# Patient Record
Sex: Male | Born: 1995 | Race: White | Hispanic: No | Marital: Single | Smoking: Never smoker
Health system: Southern US, Community
[De-identification: ages and names within clinical notes are randomized; demographics above are authoritative.]

## PROBLEM LIST (undated history)

## (undated) DIAGNOSIS — R4189 Other symptoms and signs involving cognitive functions and awareness: Secondary | ICD-10-CM

## (undated) DIAGNOSIS — R251 Tremor, unspecified: Secondary | ICD-10-CM

## (undated) DIAGNOSIS — F840 Autistic disorder: Secondary | ICD-10-CM

## (undated) DIAGNOSIS — K219 Gastro-esophageal reflux disease without esophagitis: Secondary | ICD-10-CM

## (undated) DIAGNOSIS — G40909 Epilepsy, unspecified, not intractable, without status epilepticus: Secondary | ICD-10-CM

## (undated) HISTORY — PX: OTHER SURGICAL HISTORY: SHX169

## (undated) HISTORY — DX: Tremor, unspecified: R25.1

## (undated) HISTORY — DX: Epilepsy, unspecified, not intractable, without status epilepticus: G40.909

## (undated) HISTORY — DX: Gastro-esophageal reflux disease without esophagitis: K21.9

## (undated) HISTORY — DX: Other symptoms and signs involving cognitive functions and awareness: R41.89

---

## 2017-12-24 ENCOUNTER — Emergency Department (HOSPITAL_COMMUNITY): Payer: Medicaid Other

## 2017-12-24 ENCOUNTER — Inpatient Hospital Stay (HOSPITAL_COMMUNITY)
Admission: EM | Admit: 2017-12-24 | Discharge: 2017-12-31 | DRG: 100 | Disposition: A | Discharge status: Home / Self Care | Payer: Medicaid Other | Attending: Internal Medicine | Admitting: Internal Medicine

## 2017-12-24 ENCOUNTER — Inpatient Hospital Stay (HOSPITAL_COMMUNITY): Payer: Medicaid Other

## 2017-12-24 ENCOUNTER — Encounter (HOSPITAL_COMMUNITY): Payer: Self-pay | Admitting: Internal Medicine

## 2017-12-24 DIAGNOSIS — E8770 Fluid overload, unspecified: Secondary | ICD-10-CM | POA: Diagnosis present

## 2017-12-24 DIAGNOSIS — R4182 Altered mental status, unspecified: Secondary | ICD-10-CM

## 2017-12-24 DIAGNOSIS — R519 Headache, unspecified: Secondary | ICD-10-CM

## 2017-12-24 DIAGNOSIS — R0689 Other abnormalities of breathing: Secondary | ICD-10-CM

## 2017-12-24 DIAGNOSIS — J9600 Acute respiratory failure, unspecified whether with hypoxia or hypercapnia: Secondary | ICD-10-CM | POA: Diagnosis not present

## 2017-12-24 DIAGNOSIS — R197 Diarrhea, unspecified: Secondary | ICD-10-CM | POA: Diagnosis not present

## 2017-12-24 DIAGNOSIS — R51 Headache: Secondary | ICD-10-CM | POA: Diagnosis not present

## 2017-12-24 DIAGNOSIS — I952 Hypotension due to drugs: Secondary | ICD-10-CM | POA: Diagnosis present

## 2017-12-24 DIAGNOSIS — F840 Autistic disorder: Secondary | ICD-10-CM | POA: Diagnosis present

## 2017-12-24 DIAGNOSIS — Z79899 Other long term (current) drug therapy: Secondary | ICD-10-CM

## 2017-12-24 DIAGNOSIS — R569 Unspecified convulsions: Secondary | ICD-10-CM | POA: Diagnosis not present

## 2017-12-24 DIAGNOSIS — R109 Unspecified abdominal pain: Secondary | ICD-10-CM

## 2017-12-24 DIAGNOSIS — R509 Fever, unspecified: Secondary | ICD-10-CM | POA: Diagnosis present

## 2017-12-24 DIAGNOSIS — R32 Unspecified urinary incontinence: Secondary | ICD-10-CM | POA: Diagnosis present

## 2017-12-24 DIAGNOSIS — R1013 Epigastric pain: Secondary | ICD-10-CM | POA: Diagnosis not present

## 2017-12-24 DIAGNOSIS — G40901 Epilepsy, unspecified, not intractable, with status epilepticus: Secondary | ICD-10-CM

## 2017-12-24 DIAGNOSIS — G2589 Other specified extrapyramidal and movement disorders: Secondary | ICD-10-CM

## 2017-12-24 DIAGNOSIS — J9601 Acute respiratory failure with hypoxia: Secondary | ICD-10-CM | POA: Diagnosis present

## 2017-12-24 DIAGNOSIS — G92 Toxic encephalopathy: Secondary | ICD-10-CM | POA: Diagnosis present

## 2017-12-24 DIAGNOSIS — G40A01 Absence epileptic syndrome, not intractable, with status epilepticus: Principal | ICD-10-CM | POA: Diagnosis present

## 2017-12-24 DIAGNOSIS — E876 Hypokalemia: Secondary | ICD-10-CM | POA: Diagnosis present

## 2017-12-24 DIAGNOSIS — R0600 Dyspnea, unspecified: Secondary | ICD-10-CM

## 2017-12-24 DIAGNOSIS — R251 Tremor, unspecified: Secondary | ICD-10-CM | POA: Diagnosis not present

## 2017-12-24 DIAGNOSIS — A419 Sepsis, unspecified organism: Secondary | ICD-10-CM | POA: Diagnosis not present

## 2017-12-24 DIAGNOSIS — G252 Other specified forms of tremor: Secondary | ICD-10-CM | POA: Diagnosis present

## 2017-12-24 DIAGNOSIS — E870 Hyperosmolality and hypernatremia: Secondary | ICD-10-CM | POA: Diagnosis present

## 2017-12-24 DIAGNOSIS — G259 Extrapyramidal and movement disorder, unspecified: Secondary | ICD-10-CM | POA: Diagnosis not present

## 2017-12-24 HISTORY — DX: Autistic disorder: F84.0

## 2017-12-24 LAB — CK TOTAL AND CKMB (NOT AT ARMC)
CK, MB: 1.3 ng/mL (ref 0.5–5.0)
Relative Index: 0.2 (ref 0.0–2.5)
Total CK: 794 U/L — ABNORMAL HIGH (ref 49–397)

## 2017-12-24 LAB — GLUCOSE, CAPILLARY
GLUCOSE-CAPILLARY: 89 mg/dL (ref 70–99)
Glucose-Capillary: 101 mg/dL — ABNORMAL HIGH (ref 70–99)

## 2017-12-24 LAB — CSF CELL COUNT WITH DIFFERENTIAL
RBC COUNT CSF: 6 /mm3 — AB
RBC Count, CSF: 1 /mm3 — ABNORMAL HIGH
Tube #: 1
Tube #: 4
WBC, CSF: 1 /mm3 (ref 0–5)
WBC, CSF: 3 /mm3 (ref 0–5)

## 2017-12-24 LAB — CBC WITH DIFFERENTIAL/PLATELET
Abs Immature Granulocytes: 0.05 10*3/uL (ref 0.00–0.07)
BASOS PCT: 0 %
Basophils Absolute: 0 10*3/uL (ref 0.0–0.1)
Eosinophils Absolute: 0 10*3/uL (ref 0.0–0.5)
Eosinophils Relative: 0 %
HCT: 45.3 % (ref 39.0–52.0)
Hemoglobin: 14.2 g/dL (ref 13.0–17.0)
Immature Granulocytes: 1 %
Lymphocytes Relative: 17 %
Lymphs Abs: 1.2 10*3/uL (ref 0.7–4.0)
MCH: 27.7 pg (ref 26.0–34.0)
MCHC: 31.3 g/dL (ref 30.0–36.0)
MCV: 88.5 fL (ref 80.0–100.0)
Monocytes Absolute: 1.1 10*3/uL — ABNORMAL HIGH (ref 0.1–1.0)
Monocytes Relative: 15 %
NRBC: 0 % (ref 0.0–0.2)
Neutro Abs: 4.9 10*3/uL (ref 1.7–7.7)
Neutrophils Relative %: 67 %
PLATELETS: 269 10*3/uL (ref 150–400)
RBC: 5.12 MIL/uL (ref 4.22–5.81)
RDW: 13 % (ref 11.5–15.5)
WBC: 7.3 10*3/uL (ref 4.0–10.5)

## 2017-12-24 LAB — MAGNESIUM: Magnesium: 2.3 mg/dL (ref 1.7–2.4)

## 2017-12-24 LAB — RESPIRATORY PANEL BY PCR
ADENOVIRUS-RVPPCR: NOT DETECTED
Bordetella pertussis: NOT DETECTED
CORONAVIRUS 229E-RVPPCR: NOT DETECTED
CORONAVIRUS NL63-RVPPCR: NOT DETECTED
Chlamydophila pneumoniae: NOT DETECTED
Coronavirus HKU1: NOT DETECTED
Coronavirus OC43: NOT DETECTED
Influenza A: NOT DETECTED
Influenza B: NOT DETECTED
Metapneumovirus: NOT DETECTED
Mycoplasma pneumoniae: NOT DETECTED
Parainfluenza Virus 1: NOT DETECTED
Parainfluenza Virus 2: NOT DETECTED
Parainfluenza Virus 3: NOT DETECTED
Parainfluenza Virus 4: NOT DETECTED
Respiratory Syncytial Virus: NOT DETECTED
Rhinovirus / Enterovirus: NOT DETECTED

## 2017-12-24 LAB — CBC
HCT: 39.7 % (ref 39.0–52.0)
Hemoglobin: 13 g/dL (ref 13.0–17.0)
MCH: 29.5 pg (ref 26.0–34.0)
MCHC: 32.7 g/dL (ref 30.0–36.0)
MCV: 90 fL (ref 80.0–100.0)
Platelets: 247 10*3/uL (ref 150–400)
RBC: 4.41 MIL/uL (ref 4.22–5.81)
RDW: 13.2 % (ref 11.5–15.5)
WBC: 10.1 10*3/uL (ref 4.0–10.5)
nRBC: 0 % (ref 0.0–0.2)

## 2017-12-24 LAB — I-STAT ARTERIAL BLOOD GAS, ED
Acid-base deficit: 1 mmol/L (ref 0.0–2.0)
Bicarbonate: 24.1 mmol/L (ref 20.0–28.0)
O2 Saturation: 100 %
Patient temperature: 98.6
TCO2: 25 mmol/L (ref 22–32)
pCO2 arterial: 38.7 mmHg (ref 32.0–48.0)
pH, Arterial: 7.401 (ref 7.350–7.450)
pO2, Arterial: 459 mmHg — ABNORMAL HIGH (ref 83.0–108.0)

## 2017-12-24 LAB — BASIC METABOLIC PANEL
Anion gap: 14 (ref 5–15)
BUN: 14 mg/dL (ref 6–20)
CO2: 18 mmol/L — AB (ref 22–32)
CREATININE: 0.9 mg/dL (ref 0.61–1.24)
Calcium: 9.2 mg/dL (ref 8.9–10.3)
Chloride: 118 mmol/L — ABNORMAL HIGH (ref 98–111)
GFR calc Af Amer: >60 mL/min (ref >60)
GFR calc non Af Amer: >60 mL/min (ref >60)
Glucose, Bld: 105 mg/dL — ABNORMAL HIGH (ref 70–99)
Potassium: 3.8 mmol/L (ref 3.5–5.1)
Sodium: 150 mmol/L — ABNORMAL HIGH (ref 135–145)

## 2017-12-24 LAB — PROCALCITONIN: Procalcitonin: <0.1 ng/mL

## 2017-12-24 LAB — I-STAT CG4 LACTIC ACID, ED: Lactic Acid, Venous: 0.91 mmol/L (ref 0.5–1.9)

## 2017-12-24 LAB — PHOSPHORUS: Phosphorus: 2.8 mg/dL (ref 2.5–4.6)

## 2017-12-24 LAB — PROTEIN, CSF: Total  Protein, CSF: 19 mg/dL (ref 15–45)

## 2017-12-24 LAB — COMPREHENSIVE METABOLIC PANEL
ALT: 38 U/L (ref 0–44)
AST: 42 U/L — ABNORMAL HIGH (ref 15–41)
Albumin: 3.8 g/dL (ref 3.5–5.0)
Alkaline Phosphatase: 96 U/L (ref 38–126)
Anion gap: 14 (ref 5–15)
BUN: 15 mg/dL (ref 6–20)
CO2: 21 mmol/L — ABNORMAL LOW (ref 22–32)
Calcium: 9.8 mg/dL (ref 8.9–10.3)
Chloride: 115 mmol/L — ABNORMAL HIGH (ref 98–111)
Creatinine, Ser: 1.08 mg/dL (ref 0.61–1.24)
GFR calc Af Amer: >60 mL/min (ref >60)
GFR calc non Af Amer: >60 mL/min (ref >60)
Glucose, Bld: 106 mg/dL — ABNORMAL HIGH (ref 70–99)
POTASSIUM: 3.2 mmol/L — AB (ref 3.5–5.1)
Sodium: 150 mmol/L — ABNORMAL HIGH (ref 135–145)
Total Bilirubin: 1.7 mg/dL — ABNORMAL HIGH (ref 0.3–1.2)
Total Protein: 7.2 g/dL (ref 6.5–8.1)

## 2017-12-24 LAB — LACTIC ACID, PLASMA: Lactic Acid, Venous: 1 mmol/L (ref 0.5–1.9)

## 2017-12-24 LAB — INFLUENZA PANEL BY PCR (TYPE A & B)
INFLBPCR: NEGATIVE
Influenza A By PCR: NEGATIVE

## 2017-12-24 LAB — MRSA PCR SCREENING: MRSA by PCR: NEGATIVE

## 2017-12-24 LAB — LIPASE, BLOOD: Lipase: 30 U/L (ref 11–51)

## 2017-12-24 LAB — TRIGLYCERIDES: TRIGLYCERIDES: 108 mg/dL (ref <150)

## 2017-12-24 LAB — TROPONIN I: Troponin I: <0.03 ng/mL (ref <0.03)

## 2017-12-24 LAB — GLUCOSE, CSF: GLUCOSE CSF: 87 mg/dL — AB (ref 40–70)

## 2017-12-24 MED ORDER — LACTATED RINGERS IV BOLUS
1000.0000 mL | Freq: Once | INTRAVENOUS | Status: AC
  Administered 2017-12-24: 1000 mL via INTRAVENOUS

## 2017-12-24 MED ORDER — DEXAMETHASONE SODIUM PHOSPHATE 10 MG/ML IJ SOLN: 10.0000 mg | Freq: Once | INTRAMUSCULAR | Status: DC

## 2017-12-24 MED ORDER — LEVETIRACETAM IN NACL 500 MG/100ML IV SOLN
500.0000 mg | Freq: Two times a day (BID) | INTRAVENOUS | Status: DC
  Administered 2017-12-24 – 2017-12-28 (×9): 500 mg via INTRAVENOUS
  Filled 2017-12-24 (×9): qty 100

## 2017-12-24 MED ORDER — SODIUM CHLORIDE 0.9 % IV SOLN
2.0000 g | Freq: Two times a day (BID) | INTRAVENOUS | Status: DC
  Administered 2017-12-25 (×2): 2 g via INTRAVENOUS
  Filled 2017-12-24 (×2): qty 20

## 2017-12-24 MED ORDER — LORAZEPAM 2 MG/ML IJ SOLN
1.0000 mg | Freq: Once | INTRAMUSCULAR | Status: AC
  Administered 2017-12-24: 1 mg via INTRAVENOUS

## 2017-12-24 MED ORDER — METRONIDAZOLE IN NACL 5-0.79 MG/ML-% IV SOLN
500.0000 mg | Freq: Three times a day (TID) | INTRAVENOUS | Status: DC
  Administered 2017-12-24: 500 mg via INTRAVENOUS
  Filled 2017-12-24: qty 100

## 2017-12-24 MED ORDER — CHLORHEXIDINE GLUCONATE 0.12% ORAL RINSE (MEDLINE KIT)
15.0000 mL | Freq: Two times a day (BID) | OROMUCOSAL | Status: DC
  Administered 2017-12-24 – 2017-12-31 (×10): 15 mL via OROMUCOSAL

## 2017-12-24 MED ORDER — LORAZEPAM 2 MG/ML IJ SOLN
2.0000 mg | Freq: Once | INTRAMUSCULAR | Status: AC
  Administered 2017-12-24: 2 mg via INTRAVENOUS

## 2017-12-24 MED ORDER — SODIUM CHLORIDE 0.9 % IV SOLN
INTRAVENOUS | Status: DC | PRN
  Administered 2017-12-24 – 2017-12-28 (×4): 1000 mL via INTRAVENOUS

## 2017-12-24 MED ORDER — ONDANSETRON HCL 4 MG/2ML IJ SOLN
INTRAMUSCULAR | Status: AC
  Filled 2017-12-24: qty 2

## 2017-12-24 MED ORDER — VANCOMYCIN HCL IN DEXTROSE 1-5 GM/200ML-% IV SOLN
1000.0000 mg | Freq: Once | INTRAVENOUS | Status: AC
  Administered 2017-12-24: 1000 mg via INTRAVENOUS
  Filled 2017-12-24: qty 200

## 2017-12-24 MED ORDER — PROPOFOL 1000 MG/100ML IV EMUL
INTRAVENOUS | Status: AC
  Filled 2017-12-24: qty 100

## 2017-12-24 MED ORDER — SODIUM CHLORIDE 0.9 % IV SOLN
2.0000 g | INTRAVENOUS | Status: DC
  Filled 2017-12-24 (×3): qty 2000

## 2017-12-24 MED ORDER — SODIUM CHLORIDE 0.9 % IV SOLN
1500.0000 mg | INTRAVENOUS | Status: AC
  Administered 2017-12-24: 1500 mg via INTRAVENOUS
  Filled 2017-12-24: qty 30

## 2017-12-24 MED ORDER — VANCOMYCIN HCL IN DEXTROSE 1-5 GM/200ML-% IV SOLN
1000.0000 mg | Freq: Two times a day (BID) | INTRAVENOUS | Status: DC
  Administered 2017-12-24: 1000 mg via INTRAVENOUS
  Filled 2017-12-24 (×2): qty 200

## 2017-12-24 MED ORDER — LORAZEPAM 2 MG/ML IJ SOLN
INTRAMUSCULAR | Status: AC
  Filled 2017-12-24: qty 1

## 2017-12-24 MED ORDER — PANTOPRAZOLE SODIUM 40 MG IV SOLR
40.0000 mg | Freq: Every day | INTRAVENOUS | Status: DC
  Administered 2017-12-25 – 2017-12-27 (×3): 40 mg via INTRAVENOUS
  Filled 2017-12-24 (×4): qty 40

## 2017-12-24 MED ORDER — SODIUM CHLORIDE 0.9 % IV SOLN
INTRAVENOUS | Status: DC
  Administered 2017-12-24: 16:00:00 via INTRAVENOUS

## 2017-12-24 MED ORDER — LORAZEPAM 2 MG/ML IJ SOLN
INTRAMUSCULAR | Status: AC | PRN
  Administered 2017-12-24: 2 mg via INTRAVENOUS

## 2017-12-24 MED ORDER — LORAZEPAM 2 MG/ML IJ SOLN
1.0000 mg | INTRAMUSCULAR | Status: DC | PRN
  Administered 2017-12-24: 2 mg via INTRAVENOUS
  Filled 2017-12-24: qty 1

## 2017-12-24 MED ORDER — SODIUM CHLORIDE 0.9 % IV SOLN: 2.0000 g | Freq: Two times a day (BID) | INTRAVENOUS | Status: DC

## 2017-12-24 MED ORDER — LIDOCAINE HCL (PF) 1 % IJ SOLN
10.0000 mL | Freq: Once | INTRAMUSCULAR | Status: AC
  Administered 2017-12-24: 10 mL via INTRADERMAL

## 2017-12-24 MED ORDER — LORAZEPAM 2 MG/ML IJ SOLN: 1.0000 mg | Freq: Once | INTRAMUSCULAR | Status: DC

## 2017-12-24 MED ORDER — POTASSIUM CHLORIDE 10 MEQ/100ML IV SOLN
10.0000 meq | INTRAVENOUS | Status: AC
  Administered 2017-12-24 (×5): 10 meq via INTRAVENOUS
  Filled 2017-12-24 (×5): qty 100

## 2017-12-24 MED ORDER — PHENYTOIN SODIUM 50 MG/ML IJ SOLN
100.0000 mg | Freq: Three times a day (TID) | INTRAMUSCULAR | Status: DC
  Administered 2017-12-24 – 2017-12-28 (×12): 100 mg via INTRAVENOUS
  Filled 2017-12-24 (×14): qty 2

## 2017-12-24 MED ORDER — ETOMIDATE 2 MG/ML IV SOLN
INTRAVENOUS | Status: AC | PRN
  Administered 2017-12-24: 10 mg via INTRAVENOUS

## 2017-12-24 MED ORDER — SODIUM CHLORIDE 0.9 % IV SOLN: 250.0000 mL | INTRAVENOUS | Status: DC

## 2017-12-24 MED ORDER — ACETAMINOPHEN 650 MG RE SUPP
650.0000 mg | Freq: Once | RECTAL | Status: AC
  Administered 2017-12-24: 650 mg via RECTAL
  Filled 2017-12-24: qty 1

## 2017-12-24 MED ORDER — LACTATED RINGERS IV SOLN
INTRAVENOUS | Status: DC
  Administered 2017-12-24 – 2017-12-26 (×3): via INTRAVENOUS

## 2017-12-24 MED ORDER — SODIUM CHLORIDE 0.9 % IV SOLN
2.0000 g | Freq: Once | INTRAVENOUS | Status: AC
  Administered 2017-12-24: 2 g via INTRAVENOUS
  Filled 2017-12-24: qty 2

## 2017-12-24 MED ORDER — ACETAMINOPHEN 160 MG/5ML PO SOLN: 650.0000 mg | ORAL | Status: DC | PRN

## 2017-12-24 MED ORDER — SODIUM CHLORIDE 0.9 % IV BOLUS
1000.0000 mL | Freq: Once | INTRAVENOUS | Status: AC
  Administered 2017-12-24: 1000 mL via INTRAVENOUS

## 2017-12-24 MED ORDER — PHENYLEPHRINE HCL-NACL 10-0.9 MG/250ML-% IV SOLN
25.0000 ug/min | INTRAVENOUS | Status: DC
  Administered 2017-12-24: 25 ug/min via INTRAVENOUS
  Administered 2017-12-24: 45 ug/min via INTRAVENOUS
  Administered 2017-12-24 – 2017-12-25 (×2): 55 ug/min via INTRAVENOUS
  Administered 2017-12-25: 50 ug/min via INTRAVENOUS
  Filled 2017-12-24 (×5): qty 250

## 2017-12-24 MED ORDER — FENTANYL CITRATE (PF) 100 MCG/2ML IJ SOLN
100.0000 ug | INTRAMUSCULAR | Status: DC | PRN
  Administered 2017-12-26: 100 ug via INTRAVENOUS
  Filled 2017-12-24 (×2): qty 2

## 2017-12-24 MED ORDER — ORAL CARE MOUTH RINSE
15.0000 mL | OROMUCOSAL | Status: DC
  Administered 2017-12-24 – 2017-12-27 (×25): 15 mL via OROMUCOSAL

## 2017-12-24 MED ORDER — FENTANYL CITRATE (PF) 100 MCG/2ML IJ SOLN
100.0000 ug | INTRAMUSCULAR | Status: DC | PRN
  Administered 2017-12-25 – 2017-12-27 (×3): 100 ug via INTRAVENOUS
  Filled 2017-12-24 (×3): qty 2

## 2017-12-24 MED ORDER — PROPOFOL 1000 MG/100ML IV EMUL
0.0000 ug/kg/min | INTRAVENOUS | Status: DC
  Administered 2017-12-24: 15 ug/kg/min via INTRAVENOUS
  Administered 2017-12-25: 35 ug/kg/min via INTRAVENOUS
  Administered 2017-12-25: 34.73 ug/kg/min via INTRAVENOUS
  Administered 2017-12-25: 35 ug/kg/min via INTRAVENOUS
  Administered 2017-12-25: 20 ug/kg/min via INTRAVENOUS
  Administered 2017-12-26: 30 ug/kg/min via INTRAVENOUS
  Administered 2017-12-26: 20 ug/kg/min via INTRAVENOUS
  Administered 2017-12-26: 25 ug/kg/min via INTRAVENOUS
  Administered 2017-12-27: 40 ug/kg/min via INTRAVENOUS
  Administered 2017-12-27: 35 ug/kg/min via INTRAVENOUS
  Filled 2017-12-24 (×9): qty 100

## 2017-12-24 MED ORDER — ONDANSETRON HCL 4 MG/2ML IJ SOLN
4.0000 mg | Freq: Once | INTRAMUSCULAR | Status: AC
  Administered 2017-12-24: 4 mg via INTRAVENOUS

## 2017-12-24 MED ORDER — ACETAMINOPHEN 325 MG PO TABS: 650.0000 mg | ORAL_TABLET | ORAL | Status: DC | PRN

## 2017-12-24 NOTE — Progress Notes (Signed)
   Clifford BenderDavid Mosley [id number] Transfer Data: 12/24/2017 7:43 PM Attending Provider: Kalman Shanamaswamy, Murali, MD WUJ:[id number]:System, Pcp Not In Code Status: full   Clifford BenderDavid Mosley is a 22 y.o. male patient transferred from ED Cardiac Monitoring:  [id number]   Blood pressure 100/60, pulse 96, temperature 99.5 F (37.5 C), temperature source Rectal, resp. rate 16, height 5\' 7"  (1.702 m), weight 90.7 kg, SpO2 100 %.  Allergies:  Patient has no known allergies.  Past Medical History:   has no past medical history on file.  Past Surgical History:   has no past surgical history on file. Will continue to evaluate and treat per MD orders.   Casper HarrisonSamantha K Chelle Cayton, RN 12/24/2017 6:04 PM

## 2017-12-24 NOTE — Progress Notes (Signed)
eLink Physician-Brief Progress Note Patient Name: Clifford BenderDavid Gleghorn DOB: 09/22/1995 MRN: [id number]   Date of Service  12/24/2017  HPI/Events of Note  Seizure activity per RN On cEEG  eICU Interventions  Increase propofol gtt as BP permits -upto 200 mcg neo Ativan 1-2 mg IV - 1 dose stat Neurology to recommend further , may need alternative med if hypotension with propofol gtt     Intervention Category Major Interventions: Seizures - evaluation and management  Rakesh V. Alva 12/24/2017, 8:40 PM

## 2017-12-24 NOTE — ED Provider Notes (Signed)
[facility] EMERGENCY DEPARTMENT Provider Note   CSN: [id number] Arrival date & time: 12/24/17  1219     History   Chief Complaint No chief complaint on file.   HPI Clifford Mosley is a 22 y.o. male.  HPI   22 yo M with PMHx autism, reported seizures though not on meds, here with AMS.  Pt reportedly has been increasingly confused, altered over the week. He has been less and less interactive. Over the past 24 hours, he began spiking fevers, chills, and general altered mental status. He has been shaking x 24 hours. Facility states he has shekn significantly since he arrived with them 1 week ago. Per report, he has a h/o seizures but is not on meds.   On arrival here, pt with rhythmic shaking of b/l UE and LE as well as eyes. Unable to provide history.  Level 5 caveat invoked as remainder of history, ROS, and physical exam limited due to patient's AMS.   History reviewed. No pertinent past medical history.  Patient Active Problem List   Diagnosis Date Noted  . Seizure ([place]) 12/24/2017  . Acute respiratory failure ([facility])    PMHx: Autism  PSHx: None  FHx: non-contributory        Home Medications    Prior to Admission medications   Medication Sig Start Date End Date Taking? Authorizing Provider  amphetamine-dextroamphetamine (ADDERALL) 20 MG tablet Take 40 mg by mouth daily.   Yes [provider]  diphenhydrAMINE (BENADRYL) 25 MG tablet Take 25 mg by mouth every 6 (six) hours as needed for sleep.   Yes [provider]  haloperidol (HALDOL) 0.5 MG tablet Take 0.5-1 mg by mouth 2 (two) times daily as needed for agitation.   Yes [provider]  risperiDONE (RISPERDAL) 3 MG tablet Take 3 mg by mouth 2 (two) times daily.   Yes [provider]    Family History No family history on file.  Social History Social History   Tobacco Use  . Smoking status: Not on file  Substance Use Topics  . Alcohol use: Not on file    . Drug use: Not on file     Allergies   Patient has no known allergies.   Review of Systems Review of Systems  Unable to perform ROS: Mental status change     Physical Exam Updated Vital Signs BP 121/72 (BP Location: Right Arm)   Pulse (!) 109   Temp 99.5 F (37.5 C) (Rectal)   Resp (!) 25   Ht 5' 7"  (1.702 m)   Wt 90.7 kg   SpO2 100%   BMI 31.32 kg/m   Physical Exam Vitals signs and nursing note reviewed.  Constitutional:      General: He is not in acute distress.    Appearance: He is well-developed. He is ill-appearing.     Comments: Rhythmic shaking b/l UE and LE, does not orient to examiner  HENT:     Head: Normocephalic and atraumatic.     Comments: Crusting of bilateral eyes. No pooling of secretions. Eyes:     Conjunctiva/sclera: Conjunctivae normal.  Neck:     Musculoskeletal: Neck supple.  Cardiovascular:     Rate and Rhythm: Regular rhythm. Tachycardia present.     Heart sounds: Normal heart sounds. No murmur. No friction rub.  Pulmonary:     Effort: Pulmonary effort is normal. Tachypnea present. No respiratory distress.     Breath sounds: Normal breath sounds. No wheezing or rales.  Abdominal:     General: There is no distension.     Palpations: Abdomen is soft.     Tenderness: There is no abdominal tenderness.  Skin:    General: Skin is warm.     Capillary Refill: Capillary refill takes less than 2 seconds.  Neurological:     Mental Status: He is disoriented.     Motor: Tremor present. No abnormal muscle tone.     Comments: Rhythmic shaking/tremor of bilateral upper and lower extremities. Tone appears normal however.Pupils 3 mm minimally reactive b/l. Does not orient to examiner, does not respond to noxious stimuli bilateral UE and LE.      ED Treatments / Results  Labs (all labs ordered are listed, but only abnormal results are displayed) Labs Reviewed  COMPREHENSIVE METABOLIC PANEL - Abnormal; Notable for the following components:       Result Value   Sodium 150 (*)    Potassium 3.2 (*)    Chloride 115 (*)    CO2 21 (*)    Glucose, Bld 106 (*)    AST 42 (*)    Total Bilirubin 1.7 (*)    All other components within normal limits  CBC WITH DIFFERENTIAL/PLATELET - Abnormal; Notable for the following components:   Monocytes Absolute 1.1 (*)    All other components within normal limits  CSF CELL COUNT WITH DIFFERENTIAL - Abnormal; Notable for the following components:   RBC Count, CSF 1 (*)    All other components within normal limits  CSF CELL COUNT WITH DIFFERENTIAL - Abnormal; Notable for the following components:   RBC Count, CSF 6 (*)    All other components within normal limits  GLUCOSE, CSF - Abnormal; Notable for the following components:   Glucose, CSF 87 (*)    All other components within normal limits  GLUCOSE, CAPILLARY - Abnormal; Notable for the following components:   Glucose-Capillary 101 (*)    All other components within normal limits  I-STAT ARTERIAL BLOOD GAS, ED - Abnormal; Notable for the following components:   pO2, Arterial 459.0 (*)    All other components within normal limits  RESPIRATORY PANEL BY PCR  CSF CULTURE  CULTURE, BLOOD (ROUTINE X 2)  CULTURE, BLOOD (ROUTINE X 2)  URINE CULTURE  CULTURE, RESPIRATORY  GRAM STAIN  INFLUENZA PANEL BY PCR (TYPE A & B)  PROTEIN, CSF  URINALYSIS, ROUTINE W REFLEX MICROSCOPIC  STREP PNEUMONIAE URINARY ANTIGEN  LEGIONELLA PNEUMOPHILA SEROGP 1 UR AG  HIV ANTIBODY (ROUTINE TESTING W REFLEX)  PROCALCITONIN  BASIC METABOLIC PANEL  BASIC METABOLIC PANEL  BASIC METABOLIC PANEL  BASIC METABOLIC PANEL  MAGNESIUM  PHOSPHORUS  LIPASE, BLOOD  TROPONIN I  TROPONIN I  TROPONIN I  LACTIC ACID, PLASMA  LACTIC ACID, PLASMA  CBC  BLOOD GAS, ARTERIAL  TRIGLYCERIDES  CK TOTAL AND CKMB (NOT AT [facility])  MYOGLOBIN, URINE  PROCALCITONIN  CBC  BLOOD GAS, ARTERIAL  MAGNESIUM  PHOSPHORUS  LACTIC ACID, PLASMA  I-STAT CG4 LACTIC ACID, ED  I-STAT CG4 LACTIC  ACID, ED    EKG EKG Interpretation  Date/Time:  Monday December 24 2017 12:19:49 EST Ventricular Rate:  121 PR Interval:    QRS Duration: 76 QT Interval:  375 QTC Calculation: 533 R Axis:   23 Text Interpretation:  Sinus tachycardia Prolonged QT interval No prior ECG for comparison.  Sinus tachycardia.  No STEMI Confirmed by Antony Blackbird [phone]) on 12/24/2017 5:13:59 PM   Radiology Ct Head Wo Contrast  Result Date: 12/24/2017 CLINICAL  DATA:  Altered mental status EXAM: CT HEAD WITHOUT CONTRAST TECHNIQUE: Contiguous axial images were obtained from the base of the skull through the vertex without intravenous contrast. COMPARISON:  None. FINDINGS: Brain: There is no mass, hemorrhage or extra-axial collection. The size and configuration of the ventricles and extra-axial CSF spaces are normal. The brain parenchyma is normal, without evidence of acute or chronic infarction. Vascular: No abnormal hyperdensity of the major intracranial arteries or dural venous sinuses. No intracranial atherosclerosis. Skull: The visualized skull base, calvarium and extracranial soft tissues are normal. Sinuses/Orbits: No fluid levels or advanced mucosal thickening of the visualized paranasal sinuses. No mastoid or middle ear effusion. The orbits are normal. IMPRESSION: Normal head CT. Electronically Signed   By: Ulyses Jarred M.D.   On: 12/24/2017 14:52   Dg Chest Portable 1 View  Result Date: 12/24/2017 CLINICAL DATA:  Intubation EXAM: PORTABLE CHEST 1 VIEW COMPARISON:  12/24/2017 at 12:27 p.m. FINDINGS: Support Apparatus: --Endotracheal tube: Tip at the level of the clavicular heads. --Enteric tube:Coiled within the stomach --Catheter(s):None --Other: None The heart size and mediastinal contours are within normal limits. The lungs are clear. No pleural effusion or pneumothorax. IMPRESSION: 1. No acute pulmonary process. 2. Endotracheal tube tip at the level of the clavicles and enteric tube tip and side port  within the stomach. Electronically Signed   By: Ulyses Jarred M.D.   On: 12/24/2017 14:20   Dg Chest Portable 1 View  Result Date: 12/24/2017 CLINICAL DATA:  Sepsis. EXAM: PORTABLE CHEST 1 VIEW COMPARISON:  None. FINDINGS: The cardiac silhouette, mediastinal and hilar contours are within normal limits given the low lung volumes and AP projection. The lungs are clear. No pleural effusion. Slightly dilated air-filled bowel noted in the upper abdomen. No free air. IMPRESSION: No acute cardiopulmonary findings. Electronically Signed   By: Marijo Sanes M.D.   On: 12/24/2017 12:51    Procedures .Critical Care Performed by: Duffy Bruce, MD Authorized by: Duffy Bruce, MD   Critical care provider statement:    Critical care time (minutes):  65   Critical care time was exclusive of:  Separately billable procedures and treating other patients and teaching time   Critical care was necessary to treat or prevent imminent or life-threatening deterioration of the following conditions:  Circulatory failure, CNS failure or compromise, cardiac failure and respiratory failure   Critical care was time spent personally by me on the following activities:  Development of treatment plan with patient or surrogate, discussions with consultants, evaluation of patient's response to treatment, examination of patient, obtaining history from patient or surrogate, ordering and performing treatments and interventions, ordering and review of laboratory studies, ordering and review of radiographic studies, pulse oximetry, re-evaluation of patient's condition and review of old charts   I assumed direction of critical care for this patient from another provider in my specialty: no   .Lumbar Puncture Date/Time: 12/24/2017 4:30 PM Performed by: Duffy Bruce, MD Authorized by: Duffy Bruce, MD   Consent:    Consent obtained:  Emergent situation   Consent given by:  Patient and parent   Risks discussed:  Bleeding,  headache, infection, nerve damage, repeat procedure and pain Pre-procedure details:    Procedure purpose:  Diagnostic   Preparation: Patient was prepped and draped in usual sterile fashion   Sedation:    Sedation type: Intubated, sedated on propofol. Anesthesia (see MAR for exact dosages):    Anesthesia method:  Local infiltration   Local anesthetic:  Lidocaine 1% w/o epi  Procedure details:    Lumbar space:  L3-L4 interspace   Patient position:  L lateral decubitus   Needle gauge:  20   Needle type:  Spinal needle - Quincke tip   Needle length (in):  3.5   Number of attempts:  1   Fluid appearance:  Clear   Tubes of fluid:  4   Total volume (ml):  8 Post-procedure:    Puncture site:  Adhesive bandage applied and direct pressure applied   Patient tolerance of procedure:  Tolerated well, no immediate complications Comments:     Unable to obtain pressure due to pt moving from procedure despite sedation Procedure Name: Intubation Date/Time: 12/24/2017 6:14 PM Performed by: Duffy Bruce, MD Pre-anesthesia Checklist: Patient identified, Patient being monitored, Emergency Drugs available, Timeout performed and Suction available Oxygen Delivery Method: Non-rebreather mask Preoxygenation: Pre-oxygenation with 100% oxygen Induction Type: Rapid sequence Ventilation: Mask ventilation without difficulty Laryngoscope Size: Mac and 4 Grade View: Grade I Tube size: 7.5 mm Number of attempts: 1 Airway Equipment and Method: Rigid stylet and Video-laryngoscopy Placement Confirmation: ETT inserted through vocal cords under direct vision,  CO2 detector and Breath sounds checked- equal and bilateral Secured at: 23 cm Tube secured with: Tape Dental Injury: Teeth and Oropharynx as per pre-operative assessment  Difficulty Due To: Difficulty was unanticipated Future Recommendations: Recommend- induction with short-acting agent, and alternative techniques readily available      (including  critical care time)  Medications Ordered in ED Medications  LORazepam (ATIVAN) 2 MG/ML injection (  Not Given 12/24/17 1241)  ondansetron (ZOFRAN) 4 MG/2ML injection (has no administration in time range)  LORazepam (ATIVAN) 2 MG/ML injection (has no administration in time range)  LORazepam (ATIVAN) 2 MG/ML injection (has no administration in time range)  vancomycin (VANCOCIN) IVPB 1000 mg/200 mL premix (has no administration in time range)  propofol (DIPRIVAN) 1000 MG/100ML infusion (7.5 mcg/kg/min  Rate/Dose Change 12/24/17 1547)  0.9 %  sodium chloride infusion (has no administration in time range)  phenylephrine (NEOSYNEPHRINE) 10-0.9 MG/250ML-% infusion (35 mcg/min Intravenous Rate/Dose Change 12/24/17 1548)  cefTRIAXone (ROCEPHIN) 2 g in sodium chloride 0.9 % 100 mL IVPB (has no administration in time range)  lactated ringers infusion (has no administration in time range)  pantoprazole (PROTONIX) injection 40 mg (has no administration in time range)  levETIRAcetam (KEPPRA) IVPB 500 mg/100 mL premix (has no administration in time range)  fentaNYL (SUBLIMAZE) injection 100 mcg (has no administration in time range)  fentaNYL (SUBLIMAZE) injection 100 mcg (has no administration in time range)  propofol (DIPRIVAN) 1000 MG/100ML infusion (has no administration in time range)  potassium chloride 10 mEq in 100 mL IVPB (has no administration in time range)  LORazepam (ATIVAN) injection 2 mg (2 mg Intravenous Given 12/24/17 1230)  ceFEPIme (MAXIPIME) 2 g in sodium chloride 0.9 % 100 mL IVPB (0 g Intravenous Stopped 12/24/17 1336)  vancomycin (VANCOCIN) IVPB 1000 mg/200 mL premix (0 mg Intravenous Stopped 12/24/17 1451)  sodium chloride 0.9 % bolus 1,000 mL (0 mLs Intravenous Stopped 12/24/17 1450)  sodium chloride 0.9 % bolus 1,000 mL (0 mLs Intravenous Stopped 12/24/17 1451)  acetaminophen (TYLENOL) suppository 650 mg (650 mg Rectal Given 12/24/17 1318)  lactated ringers bolus 1,000 mL (0 mLs  Intravenous Stopped 12/24/17 1451)  ondansetron (ZOFRAN) injection 4 mg (4 mg Intravenous Given 12/24/17 1332)  LORazepam (ATIVAN) injection 1 mg (1 mg Intravenous Given 12/24/17 1336)  LORazepam (ATIVAN) injection 1 mg (1 mg Intravenous Given 12/24/17 1338)  fosPHENYtoin (CEREBYX) 1,500 mg  PE in sodium chloride 0.9 % 50 mL IVPB (0 mg PE Intravenous Stopped 12/24/17 1608)  LORazepam (ATIVAN) injection (2 mg Intravenous Given 12/24/17 1350)  etomidate (AMIDATE) injection (10 mg Intravenous Given 12/24/17 1359)  lidocaine (PF) (XYLOCAINE) 1 % injection 10 mL (10 mLs Intradermal Given by Other 12/24/17 1609)     Initial Impression / Assessment and Plan / ED Course  I have reviewed the triage vital signs and the nursing notes.  Pertinent labs & imaging results that were available during my care of the patient were reviewed by me and considered in my medical decision making (see chart for details).  Clinical Course as of Dec 24 1813  Mon Dec 24, 2765  3736 22 year old autistic male here with altered mental status.  On arrival, patient in Reiger's versus seizure activity.  Ativan trial given and patient had brief resolution of shaking episodes.  Patient subsequently given additional doses, with slight improvement.  However, he was noted to have progressively worsening respiratory status with difficulty controlling his secretions and gagging.  Decision subsequently made to intubate.  Prior to intubation, I asked Dr. Malen Gauze to evaluate the patient, who confirms that patient appears to be in status based on his exam and recommends Dilantin load.  Broad-spectrum antibiotics given, will send for stat CT head.  Neurology states that patient will need LP once CT is back.  Discussed with ICU who will admit.   [CI]    Clinical Course User Index [CI] Duffy Bruce, MD    CT head neg. I was asked to complete LP by ICU - LP completed, fluid clear with subjectively normal pressure. Pt HDS. Admit to  ICU.  Final Clinical Impressions(s) / ED Diagnoses   Final diagnoses:  Status epilepticus ([place])  Acute respiratory failure with hypoxia [facility])    ED Discharge Orders    None       Duffy Bruce, MD 12/24/17 1815

## 2017-12-24 NOTE — Procedures (Signed)
Intubation Procedure Note Clifford BenderDavid Mosley [id number] 10-11-1995  Procedure: Intubation Indications: Airway protection and maintenance  Procedure Details Consent: Unable to obtain consent because of altered level of consciousness. Time Out: Verified patient identification, verified procedure, site/side was marked, verified correct patient position, special equipment/implants available, medications/allergies/relevent history reviewed, required imaging and test results available.  Performed  Maximum sterile technique was used including antiseptics.  3    Evaluation Hemodynamic Status: BP stable throughout; O2 sats: stable throughout Patient's Current Condition: stable Complications: No apparent complications Patient did tolerate procedure well. Chest X-ray ordered to verify placement.  CXR: pending.   Toula MoosCampbell, Fynn Adel Faulkner 12/24/2017

## 2017-12-24 NOTE — Consult Note (Signed)
Requesting Physician:  Erma HeritageIsaacs    Chief Complaint:   History obtained from: Patient and Chart     HPI:                                                                                                                                       Clifford Mosley is an 22 y.o. male with past medical history of autism, seizures presents to the emergency department found down seizing, febrile.  Patient is a 22 year old male with past medical history of autism recently transitioned to a group home.  Was noted to have some mild tremors on the 21st and also noted to have reduced p.o. intake by nursing staff.  Today patient was found to be lethargic nonverbal and his temperature was greater than 101.  EMS was called and shortly after arrival patient was noted to have bilateral upper and lower extremity jerking. Patient continued to have seizures on arrival to ED neurology was consulted.  On my assessment patient had a forced left gaze deviation and continuous jerking of the right upper and lower extremity.  Patient was intubated for airway protection and was loaded with fosphenytoin.  Patient underwent a head CT which was unremarkable and a lumbar puncture which showed no evidence of CNS infection.   PMH H/o of seizures, limited as patient unresponsive  FH and SH; limited as patient unresponsive   Medications:                                                                                                                        I reviewed home medications   ROS:                                                                                                                                     14 systems  reviewed and negative except above    Examination:                                                                                                      General: Appears well-developed and well-nourished.  Psych: Affect appropriate to situation Eyes: No scleral injection HENT: No OP obstrucion Head:  Normocephalic.  Cardiovascular: Normal rate and regular rhythm.  Respiratory: Effort normal and breath sounds normal to anterior ascultation GI: Soft.  No distension. There is no tenderness.  Skin: WDI    Neurological Examination ( prior to intubation)  Mental Status: Unresponsive, not following commands Cranial Nerves: II: Visual fields : unable to assess III,IV, VI: pupils equal and reactive, forced gaze deviation to the left  V,VI: face symmetric  VIII: IX,X:XI: XII: unable to assess Motor: Right upper extremity with rhythmic clonic activity, withdraws on left upper and lower extremity  Tone and bulk:normal tone throughout; no atrophy noted Sensory: unable to assess Plantars: Right: downgoing   Left: downgoing Cerebellar: unable to assess Gait: unable to assess    Lab Results: Basic Metabolic Panel: Recent Labs  Lab 12/24/17 1245  NA 150*  K 3.2*  CL 115*  CO2 21*  GLUCOSE 106*  BUN 15  CREATININE 1.08  CALCIUM 9.8    CBC: Recent Labs  Lab 12/24/17 1245  WBC 7.3  NEUTROABS 4.9  HGB 14.2  HCT 45.3  MCV 88.5  PLT 269    Coagulation Studies: No results for input(s): LABPROT, INR in the last 72 hours.  Imaging: Dg Chest Portable 1 View  Result Date: 12/24/2017 CLINICAL DATA:  Intubation EXAM: PORTABLE CHEST 1 VIEW COMPARISON:  12/24/2017 at 12:27 p.m. FINDINGS: Support Apparatus: --Endotracheal tube: Tip at the level of the clavicular heads. --Enteric tube:Coiled within the stomach --Catheter(s):None --Other: None The heart size and mediastinal contours are within normal limits. The lungs are clear. No pleural effusion or pneumothorax. IMPRESSION: 1. No acute pulmonary process. 2. Endotracheal tube tip at the level of the clavicles and enteric tube tip and side port within the stomach. Electronically Signed   By: Deatra Robinson M.D.   On: 12/24/2017 14:20   Dg Chest Portable 1 View  Result Date: 12/24/2017 CLINICAL DATA:  Sepsis. EXAM: PORTABLE CHEST 1  VIEW COMPARISON:  None. FINDINGS: The cardiac silhouette, mediastinal and hilar contours are within normal limits given the low lung volumes and AP projection. The lungs are clear. No pleural effusion. Slightly dilated air-filled bowel noted in the upper abdomen. No free air. IMPRESSION: No acute cardiopulmonary findings. Electronically Signed   By: Rudie Meyer M.D.   On: 12/24/2017 12:51     I have reviewed the above imaging CT head: no acute process seen    ASSESSMENT AND PLAN  22 year old male with past medical history of autism and seizures presents with status epilepticus requiring intubation  Status epilepticus Acute respiratory failure 2/2 neurological process ?  Sepsis/SIRS  Recommendations -Load with fosphenytoin 1,500mg  and agree with intubaton -CSF results : WBC 1 not suggestive for CNS infection  - Continue Dilantin  100mg  TID - Stat EEG shows no evidence of seizures - MRI brain after EEG leads removed - continue to evaluate and treat source of sepsis - Seizure precautions     Georgiana SpinnerSushanth Aroor MD [facility] [phone]   If 7pm to 7am, please call on call as listed on AMION.   Sushanth Aroor [facility] Pager Number [phone]

## 2017-12-24 NOTE — H&P (Addendum)
NAME:  Clifford BenderDavid Austill, MRN:  [id number], DOB:  05-11-1995, LOS: 0 ADMISSION DATE:  12/24/2017, CONSULTATION DATE:  12/23 REFERRING MD:  isacc, CHIEF COMPLAINT:  Seizure and possible sepsis   Brief History    22 year old male patient with history of autism, admitted 12/23 with working diagnosis of seizure, and possible sepsis with concern for CNS infection  History of present illness    22 year old male patient with history of autism.  Had been living with mother until approximately 1 week ago when he transition to a group home. He apparently had been transitioning well, however the care providers did begin to notice him becoming more withdrawn, around the 20th and 21st with decreased p.o. intake.  He was still drinking fluids.  He felt that this was possibly secondary to his recent transition to the group home.  On the 21st care provider noted some mild tremors, EMS was called, they could find no other abnormalities, he apparently had some history of tremor in the past so they felt no further evaluation was warranted.  On the 23rd his care provider evaluated him and found him to be more lethargic, more withdrawn, nonverbal, and diaphoretic EMS was summoned.  On arrival his temperature was greater than 101, he was obtunded, and shortly after demonstrated bilateral upper and lower extremity rhythmic seizure-like activity.  This is witnessed by both the emergency room physician and neurology.  Felt to be consistent with possible seizure, he was treated with IV benzodiazepines, intubated for airway protection, blood cultures were sent and empiric antibiotics were started.  Of note he had no report of stiff neck, no fevers prior to day of admission on 12/23, no sick exposures.  At baseline he is minimally verbal, he is able to speak in 1-4 word phrases, at approximately "22-year-old level" per his mother he is ambulatory, able to eat on his own.  He has significant behavioral problems, typically becomes  combative when agitated.  Past Medical History  Autism, possible seizures   Significant Hospital Events   12/23 admitted with working diagnosis of possible sepsis, concern for CNS infection, IV vancomycin and ceftriaxone started  Consults:  Neuro 12/23  Procedures:  oett 12/23 LP 12/23  Significant Diagnostic Tests:  *CT brain 12/23: neg  Micro Data:  BCX2 12/23>>> UC 12/23>>> resp 12/23>> CSDF 12/23>>>  Antimicrobials:  vanc 12/23>>> Rocephin 12/23>>>  Interim history/subjective:  Now intubated  Objective   Blood pressure 121/72, pulse (Abnormal) 109, temperature 99.5 F (37.5 C), temperature source Rectal, resp. rate (Abnormal) 25, height 5\' 7"  (1.702 m), weight 90.7 kg, SpO2 100 %.    Vent Mode: PRVC FiO2 (%):  [100 %] 100 % Set Rate:  [16 bmp] 16 bmp Vt Set:  [500 mL] 500 mL PEEP:  [5 cmH20] 5 cmH20 Plateau Pressure:  [21 cmH20] 21 cmH20   Intake/Output Summary (Last 24 hours) at 12/24/2017 1530 Last data filed at 12/24/2017 1451 Gross per 24 hour  Intake 3700 ml  Output no documentation  Net 3700 ml   Filed Weights   12/24/17 1319  Weight: 90.7 kg    Examination: General: 22 year old white male currently unresponsive on propofol infusion HENT: Orally intubated pupils reactive bilaterally mucous membranes moist Lungs: Clear to auscultation no accessory use diminished bases Cardiovascular: Regular rate and rhythm Abdomen:  soft nontender no organomegaly, positive bowel sounds Extremities: Warm, dry, brisk capillary refill trace edema Neuro: Currently sedated.  Still exhibiting bilateral upper extremity intermittent seizure type activity GU: Condom catheter in place Resolved  Hospital Problem list     Assessment & Plan:   Status epilepticus Plan Admit to the neuro intensive care Neurology consulted Fosphenytoin initiated Continuous EEG to start Sedation with propofol Adding IV Keppra Serial neuro exams Checking total CKs IV  hydration  Acute metabolic encephalopathy, superimposed on underlying autism with behavioral problems -Working diagnosis of possible CNS infection, also has severe hypernatremia Plan Treat seizure Correct hypernatremia Treat infection Serial neuro exams Supportive care  Severe sepsis/septic shock versus drug-related hypotension, with concern for CNS infection -CT brain negative -Spoke with infectious disease via phone Plan Panculture Lumbar puncture Broad-spectrum antibiotics with vancomycin as well as Rocephin for CNS coverage Follow-up CSF evaluation Mean arterial pressure goal greater than 65 with peripheral Neo-Synephrine Continuing IV hydration Tylenol for fever 30 mL's per kilogram crystalloid resuscitation (completed in ER)  Respiratory failure w/ need for airway protection s/p seizure  Plan Full vent support  PAD protocol RASS -3 VAP bundle F/u abg  Hypernatremia/ hyperosmolar -favor volume depletion  Plan Cont IVFs w/ LR Repeat chemistry q 4 stict I&O  At risk for AKI Plan Cont IVFs Renal dose meds Ck CKs Ck urine myoglobin  Fluid and electrolyte imbalance: hypokalemia  Plan Replace K recheck   Best practice:  Diet: tubefeeds to start 12/24 Pain/Anxiety/Delirium protocol (if indicated): 12/13 VAP protocol (if indicated): 12/23 DVT prophylaxis: PAS GI prophylaxis: PPI Glucose control: na Mobility: BR Code Status: Full Family Communication: updated Disposition: Critically ill requiring titration of anticonvulsants, continuous EEG monitoring, treatment for life-threatening infection, titration of mechanical ventilation, serial neurologic checks, and intensive care monitoring.  He will be admitted to the neuro intensive care.  Labs   CBC: Recent Labs  Lab 12/24/17 1245  WBC 7.3  NEUTROABS 4.9  HGB 14.2  HCT 45.3  MCV 88.5  PLT 269    Basic Metabolic Panel: Recent Labs  Lab 12/24/17 1245  NA 150*  K 3.2*  CL 115*  CO2 21*  GLUCOSE  106*  BUN 15  CREATININE 1.08  CALCIUM 9.8   GFR: Estimated Creatinine Clearance: 116.2 mL/min (by C-G formula based on SCr of 1.08 mg/dL). Recent Labs  Lab 12/24/17 1238 12/24/17 1245  WBC  --  7.3  LATICACIDVEN 0.91  --     Liver Function Tests: Recent Labs  Lab 12/24/17 1245  AST 42*  ALT 38  ALKPHOS 96  BILITOT 1.7*  PROT 7.2  ALBUMIN 3.8   No results for input(s): LIPASE, AMYLASE in the last 168 hours. No results for input(s): AMMONIA in the last 168 hours.  ABG No results found for: PHART, PCO2ART, PO2ART, HCO3, TCO2, ACIDBASEDEF, O2SAT   Coagulation Profile: No results for input(s): INR, PROTIME in the last 168 hours.  Cardiac Enzymes: No results for input(s): CKTOTAL, CKMB, CKMBINDEX, TROPONINI in the last 168 hours.  HbA1C: No results found for: HGBA1C  CBG: No results for input(s): GLUCAP in the last 168 hours.  Review of Systems:   Not able   Past Medical History  He,  has no past medical history on file.   Surgical History   none  Social History      Family History   His family history is not on file.   Allergies Allergies not on file   Home Medications  Prior to Admission medications   Not on File     Critical care time: 45 min      Simonne MartinetPeter E Gaynelle Pastrana ACNP-BC [facility] Pager # [phone] OR # [phone] if no  answer

## 2017-12-24 NOTE — ED Triage Notes (Signed)
Code Sepsis called upon arrival, possible seizure

## 2017-12-24 NOTE — Progress Notes (Signed)
LTM EEG started in ED; pt being transferred 3M06.

## 2017-12-24 NOTE — Progress Notes (Signed)
Pharmacy Antibiotic Note  Clifford BenderDavid Mosley is a 22 y.o. male admitted on 12/24/2017 with sepsis.  Pharmacy has been consulted for vancomycin and cefepime dosing.  Plan: Vancomycin 1gm IV Q12H Cefepime 2gm IV Q12H F/u renal fxn, C&S, clinical status and vanc peak/trough at SS  Height: 5\' 7"  (170.2 cm) Weight: 200 lb (90.7 kg) IBW/kg (Calculated) : 66.1  No data recorded.  Recent Labs  Lab 12/24/17 1238 12/24/17 1245  WBC  --  7.3  LATICACIDVEN 0.91  --     CrCl cannot be calculated (No successful lab value found.).    Allergies not on file  Antimicrobials this admission: Vanc 12/23>> Cefepime 12/23>> Flagyl 12/23>>  Dose adjustments this admission: N/A  Microbiology results: Pending  Thank you for allowing pharmacy to be a part of this patient's care.  Clifford Mosley, Clifford LeachRachel Mosley 12/24/2017 1:35 PM

## 2017-12-24 NOTE — Progress Notes (Signed)
Patient continues to have twitching in upper and lower extremities. Sometimes after stimulation. [facility] called. Received order to increase Propofol, PRN ativan and to contact Neuro. Neuro paged. 1 dose of 2mg  Ativan given, Propofol gtt increased to 4420mcg/min  Will continue to monitor

## 2017-12-25 ENCOUNTER — Inpatient Hospital Stay (HOSPITAL_COMMUNITY): Payer: Medicaid Other

## 2017-12-25 DIAGNOSIS — R569 Unspecified convulsions: Secondary | ICD-10-CM

## 2017-12-25 LAB — URINALYSIS, ROUTINE W REFLEX MICROSCOPIC
Bacteria, UA: NONE SEEN
Bilirubin Urine: NEGATIVE
GLUCOSE, UA: NEGATIVE mg/dL
Hgb urine dipstick: NEGATIVE
Ketones, ur: 80 mg/dL — AB
Leukocytes, UA: NEGATIVE
Nitrite: NEGATIVE
Protein, ur: NEGATIVE mg/dL
SPECIFIC GRAVITY, URINE: 1.028 (ref 1.005–1.030)
pH: 6 (ref 5.0–8.0)

## 2017-12-25 LAB — GLUCOSE, CAPILLARY
Glucose-Capillary: 106 mg/dL — ABNORMAL HIGH (ref 70–99)
Glucose-Capillary: 112 mg/dL — ABNORMAL HIGH (ref 70–99)
Glucose-Capillary: 114 mg/dL — ABNORMAL HIGH (ref 70–99)
Glucose-Capillary: 116 mg/dL — ABNORMAL HIGH (ref 70–99)
Glucose-Capillary: 137 mg/dL — ABNORMAL HIGH (ref 70–99)
Glucose-Capillary: 93 mg/dL (ref 70–99)

## 2017-12-25 LAB — CBC
HCT: 38.3 % — ABNORMAL LOW (ref 39.0–52.0)
Hemoglobin: 12.2 g/dL — ABNORMAL LOW (ref 13.0–17.0)
MCH: 28 pg (ref 26.0–34.0)
MCHC: 31.9 g/dL (ref 30.0–36.0)
MCV: 88 fL (ref 80.0–100.0)
PLATELETS: 259 10*3/uL (ref 150–400)
RBC: 4.35 MIL/uL (ref 4.22–5.81)
RDW: 12.9 % (ref 11.5–15.5)
WBC: 8.8 10*3/uL (ref 4.0–10.5)
nRBC: 0 % (ref 0.0–0.2)

## 2017-12-25 LAB — POCT I-STAT 3, ART BLOOD GAS (G3+)
Acid-base deficit: 2 mmol/L (ref 0.0–2.0)
Bicarbonate: 21 mmol/L (ref 20.0–28.0)
O2 Saturation: 99 %
Patient temperature: 98.6
TCO2: 22 mmol/L (ref 22–32)
pCO2 arterial: 29 mmHg — ABNORMAL LOW (ref 32.0–48.0)
pH, Arterial: 7.469 — ABNORMAL HIGH (ref 7.350–7.450)
pO2, Arterial: 133 mmHg — ABNORMAL HIGH (ref 83.0–108.0)

## 2017-12-25 LAB — BASIC METABOLIC PANEL
ANION GAP: 11 (ref 5–15)
ANION GAP: 14 (ref 5–15)
BUN: 12 mg/dL (ref 6–20)
BUN: 9 mg/dL (ref 6–20)
CO2: 19 mmol/L — ABNORMAL LOW (ref 22–32)
CO2: 22 mmol/L (ref 22–32)
Calcium: 9.3 mg/dL (ref 8.9–10.3)
Calcium: 9.3 mg/dL (ref 8.9–10.3)
Chloride: 116 mmol/L — ABNORMAL HIGH (ref 98–111)
Chloride: 113 mmol/L — ABNORMAL HIGH (ref 98–111)
Creatinine, Ser: 0.73 mg/dL (ref 0.61–1.24)
Creatinine, Ser: 0.8 mg/dL (ref 0.61–1.24)
GFR calc Af Amer: >60 mL/min (ref >60)
GFR calc Af Amer: >60 mL/min (ref >60)
GFR calc non Af Amer: >60 mL/min (ref >60)
GFR calc non Af Amer: >60 mL/min (ref >60)
Glucose, Bld: 122 mg/dL — ABNORMAL HIGH (ref 70–99)
Glucose, Bld: 114 mg/dL — ABNORMAL HIGH (ref 70–99)
Potassium: 3.7 mmol/L (ref 3.5–5.1)
Potassium: 3.4 mmol/L — ABNORMAL LOW (ref 3.5–5.1)
Sodium: 146 mmol/L — ABNORMAL HIGH (ref 135–145)
Sodium: 149 mmol/L — ABNORMAL HIGH (ref 135–145)

## 2017-12-25 LAB — LACTIC ACID, PLASMA: Lactic Acid, Venous: 1.8 mmol/L (ref 0.5–1.9)

## 2017-12-25 LAB — PROCALCITONIN: Procalcitonin: <0.1 ng/mL

## 2017-12-25 LAB — TROPONIN I: Troponin I: <0.03 ng/mL (ref <0.03)

## 2017-12-25 LAB — HIV ANTIBODY (ROUTINE TESTING W REFLEX): HIV Screen 4th Generation wRfx: NONREACTIVE

## 2017-12-25 LAB — MAGNESIUM: Magnesium: 2 mg/dL (ref 1.7–2.4)

## 2017-12-25 LAB — STREP PNEUMONIAE URINARY ANTIGEN: Strep Pneumo Urinary Antigen: NEGATIVE

## 2017-12-25 LAB — PHOSPHORUS: Phosphorus: 2.3 mg/dL — ABNORMAL LOW (ref 2.5–4.6)

## 2017-12-25 MED ORDER — RISPERIDONE 3 MG PO TABS
3.0000 mg | ORAL_TABLET | Freq: Two times a day (BID) | ORAL | Status: DC
  Administered 2017-12-25 – 2017-12-27 (×4): 3 mg
  Filled 2017-12-25 (×6): qty 1

## 2017-12-25 MED ORDER — SODIUM CHLORIDE 0.9 % IV SOLN
2.0000 g | INTRAVENOUS | Status: DC
  Administered 2017-12-26: 2 g via INTRAVENOUS
  Filled 2017-12-25: qty 20

## 2017-12-25 MED ORDER — POTASSIUM PHOSPHATES 15 MMOLE/5ML IV SOLN
30.0000 mmol | Freq: Once | INTRAVENOUS | Status: AC
  Administered 2017-12-25: 30 mmol via INTRAVENOUS
  Filled 2017-12-25: qty 10

## 2017-12-25 MED ORDER — VANCOMYCIN HCL IN DEXTROSE 1-5 GM/200ML-% IV SOLN
1000.0000 mg | Freq: Three times a day (TID) | INTRAVENOUS | Status: DC
  Administered 2017-12-25: 1000 mg via INTRAVENOUS
  Filled 2017-12-25: qty 200

## 2017-12-25 MED ORDER — VITAL HIGH PROTEIN PO LIQD
1000.0000 mL | ORAL | Status: DC
  Administered 2017-12-25 – 2017-12-26 (×7): 1000 mL
  Filled 2017-12-25: qty 1000

## 2017-12-25 MED ORDER — VANCOMYCIN HCL IN DEXTROSE 1-5 GM/200ML-% IV SOLN
1000.0000 mg | Freq: Two times a day (BID) | INTRAVENOUS | Status: DC
  Administered 2017-12-25 – 2017-12-26 (×2): 1000 mg via INTRAVENOUS
  Filled 2017-12-25 (×2): qty 200

## 2017-12-25 MED ORDER — SODIUM CHLORIDE 0.9 % IV SOLN: 2.0000 g | INTRAVENOUS | Status: DC

## 2017-12-25 NOTE — Progress Notes (Addendum)
Neurology Progress Note   S:// Patient seen and examined.  One episode of shivering overnight which was seen by neurology and deemed to have no EEG correlate.  Formal EEG report pending from overnight.   O:// Current vital signs: BP 116/73   Pulse 88   Temp 97.8 F (36.6 C) (Oral)   Resp 16   Ht '5\' 7"'  (1.702 m)   Wt 88.7 kg   SpO2 100%   BMI 30.63 kg/m  Vital signs in last 24 hours: Temp:  [97.8 F (36.6 C)-101.1 F (38.4 C)] 97.8 F (36.6 C) (12/24 0730) Pulse Rate:  [30-109] 88 (12/24 0750) Resp:  [16-25] 16 (12/24 0750) BP: (83-126)/(50-79) 116/73 (12/24 0750) SpO2:  [45 %-100 %] 100 % (12/24 0750) FiO2 (%):  [40 %-100 %] 40 % (12/24 0750) Weight:  [88.7 kg-90.7 kg] 88.7 kg (12/24 0445) General: Sedated intubated HEENT: Normocephalic atraumatic ET tube in place Lungs: Clear to auscultation Cardiovascular: Sounds are regular rhythm Abdomen: Soft nondistended nontender Neurological exam Patient is sedated with propofol and intubated. No spontaneous movement. Breathing with the ventilator Brainstem reflexes present Pupils pinpoint and sluggishly reactive Corneals present No spontaneous movements No movement to noxious stimulation  Medications  Current Facility-Administered Medications:  .  0.9 %  sodium chloride infusion, 250 mL, Intravenous, Continuous, Babcock, Peter E, NP .  0.9 %  sodium chloride infusion, , Intravenous, PRN, Brand Males, MD, Last Rate: 10 mL/hr at 12/25/17 0600 .  acetaminophen (TYLENOL) solution 650 mg, 650 mg, Per Tube, Q4H PRN, Rigoberto Noel, MD .  cefTRIAXone (ROCEPHIN) 2 g in sodium chloride 0.9 % 100 mL IVPB, 2 g, Intravenous, Q12H, Bertis Ruddy, RPH, Stopped at 12/25/17 0118 .  chlorhexidine gluconate (MEDLINE KIT) (PERIDEX) 0.12 % solution 15 mL, 15 mL, Mouth Rinse, BID, Ramaswamy, Murali, MD, 15 mL at 12/24/17 1948 .  fentaNYL (SUBLIMAZE) injection 100 mcg, 100 mcg, Intravenous, Q15 min PRN, Erick Colace, NP .   fentaNYL (SUBLIMAZE) injection 100 mcg, 100 mcg, Intravenous, Q2H PRN, Erick Colace, NP .  lactated ringers infusion, , Intravenous, Continuous, Erick Colace, NP, Last Rate: 100 mL/hr at 12/25/17 0600 .  levETIRAcetam (KEPPRA) IVPB 500 mg/100 mL premix, 500 mg, Intravenous, Q12H, Erick Colace, NP, Stopped at 12/25/17 0459 .  LORazepam (ATIVAN) injection 1-2 mg, 1-2 mg, Intravenous, Q1H PRN, Rigoberto Noel, MD, 2 mg at 12/24/17 2238 .  MEDLINE mouth rinse, 15 mL, Mouth Rinse, 10 times per day, Brand Males, MD, 15 mL at 12/25/17 0602 .  pantoprazole (PROTONIX) injection 40 mg, 40 mg, Intravenous, Daily, Salvadore Dom E, NP .  phenylephrine (NEOSYNEPHRINE) 10-0.9 MG/250ML-% infusion, 25-200 mcg/min, Intravenous, Titrated, Erick Colace, NP, Last Rate: 75 mL/hr at 12/25/17 0600, 50 mcg/min at 12/25/17 0600 .  phenytoin (DILANTIN) injection 100 mg, 100 mg, Intravenous, Q8H, Aroor, Karena Addison R, MD, 100 mg at 12/25/17 0559 .  potassium PHOSPHATE 30 mmol in dextrose 5 % 500 mL infusion, 30 mmol, Intravenous, Once, Rigoberto Noel, MD, Last Rate: 85 mL/hr at 12/25/17 0649, 30 mmol at 12/25/17 0649 .  propofol (DIPRIVAN) 1000 MG/100ML infusion, 0-50 mcg/kg/min, Intravenous, Continuous, Erick Colace, NP, Last Rate: 18.9 mL/hr at 12/25/17 0600, 35 mcg/kg/min at 12/25/17 0600 .  vancomycin (VANCOCIN) IVPB 1000 mg/200 mL premix, 1,000 mg, Intravenous, Q8H, Lavenia Atlas [facility] Labs CBC    Component Value Date/Time   WBC 8.8 12/25/2017 0349   RBC 4.35 12/25/2017 0349   HGB 12.2 (L) 12/25/2017 0349  HCT 38.3 (L) 12/25/2017 0349   PLT 259 12/25/2017 0349   MCV 88.0 12/25/2017 0349   MCH 28.0 12/25/2017 0349   MCHC 31.9 12/25/2017 0349   RDW 12.9 12/25/2017 0349   LYMPHSABS 1.2 12/24/2017 1245   MONOABS 1.1 (H) 12/24/2017 1245   EOSABS 0.0 12/24/2017 1245   BASOSABS 0.0 12/24/2017 1245    CMP     Component Value Date/Time   NA 149 (H) 12/25/2017 0349   K 3.4 (L)  12/25/2017 0349   CL 113 (H) 12/25/2017 0349   CO2 22 12/25/2017 0349   GLUCOSE 114 (H) 12/25/2017 0349   BUN 9 12/25/2017 0349   CREATININE 0.80 12/25/2017 0349   CALCIUM 9.3 12/25/2017 0349   PROT 7.2 12/24/2017 1245   ALBUMIN 3.8 12/24/2017 1245   AST 42 (H) 12/24/2017 1245   ALT 38 12/24/2017 1245   ALKPHOS 96 12/24/2017 1245   BILITOT 1.7 (H) 12/24/2017 1245   GFRNONAA >60 12/25/2017 0349   GFRAA >60 12/25/2017 0349   Spinal tap was done yesterday. Protein 19, glucose 87, 1 RBC, 1 WBC in tube 1.  6 RBCs and 3 WBCs in tube 4.  Imaging I have reviewed images in epic and the results pertinent to this consultation are: CT head with no acute changes.  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 Spinal tap not concerning for a CNS infection. Continues to have some metabolic derangements including hypernatremia, hypokalemia, hyperglycemia and mildly deranged liver function No leukocytosis.  Impression: Breakthrough seizures in a patient with autism and history of seizures and propensity for seizures-with lower seizure threshold in the setting of toxic metabolic derangements/febrile illness.  Status epilepticus-presumably resolved  Recommendations: Await overnight EEG report Continue Dilantin 100 3 times daily MRI after the EEG leads are removed Continue and evaluate for source of sepsis and treat per primary team. Spoke with the family at bedside.  Answered all their questions to the best of my ability. Neurology will continue to follow with you  I will addend/update recommendations upon EEG result availability.  -- Amie Portland, MD [facility] Pager: [phone] If 7pm to 7am, please call on call as listed on [facility].  CRITICAL CARE  ATTESTATION Performed by: Amie Portland, MD Total critical care time: 30 minutes Critical care time was exclusive of separately billable procedures and treating other patients and/or supervising APPs/Residents/Students Critical care was necessary to treat or prevent imminent or life-threatening deterioration due to seizures, status epilepticus This patient is critically ill and at significant risk for neurological worsening and/or death and care requires constant monitoring. Critical care was time spent personally by me on the following activities: development of treatment plan with patient and/or surrogate as well as nursing, discussions with consultants, evaluation of patient's response to treatment, examination of patient, obtaining history from patient or surrogate, ordering and performing treatments and interventions, ordering and review of laboratory studies, ordering and review of radiographic studies, pulse oximetry, re-evaluation of patient's condition, participation in multidisciplinary rounds and medical decision making of high complexity in the care of this patient.   Addendum EEG shows diffuse encephalopathy with intermittent arousals to full wakefulness.  Sedating medication effects suspected. At this time, I have requested the nurse to turn down the propofol in a gradual manner to discontinued in  the next 1 to 2 hours. If he does not demonstrate any seizure activity off of the medication, the long-term EEG can be discontinued and an MRI should be obtained at some point as a part of the work-up. Continue him on Dilantin for now I will continue to follow with you  -- Amie Portland, MD [facility] Pager: [phone] If 7pm to 7am, please call on call as listed on AMION.

## 2017-12-25 NOTE — Progress Notes (Signed)
While titrating patient's propofol down the patient started tremoring in his upper and lower extremities.  The doctor was notified about the patient's condition and he reviewed the continuous EEG.  We will keep the patient on continuous EEG as per neuro's order.  Will continue to monitor.

## 2017-12-25 NOTE — Progress Notes (Signed)
eLink Physician-Brief Progress Note Patient Name: Clifford BenderDavid Mosley DOB: 1995-06-22 MRN: [id number]   Date of Service  12/25/2017  HPI/Events of Note    eICU Interventions  Hypokalemia/ hypophos  -repleted      Intervention Category Intermediate Interventions: Electrolyte abnormality - evaluation and management  Rakesh V. Alva 12/25/2017, 5:20 AM

## 2017-12-25 NOTE — Procedures (Signed)
   [facility] __________________  Video EEG Monitoring Report   CPT/Type of Study: [phone]; 24hr EEG with video Recording dates: 12/24/17 @16 :15 to 12/25/17 @07 :30 Recording day: 1 (started on 12/23) Requesting provider: Katrinka BlazingSmith Interpreting physician: Delbert Phenixan-Andrei Loyd Salvador, MD  Indications for Procedure: Breakthrough seizures Primary neurological diagnosis: Epilepsy  History: This is an 22 year old patient, undergoing continuous EEG to evaluate for seizures.   EEG Details: Continuous video EEG was performed using standard setting per the guidelines of American Clinical Neurophysiology Society (ACNS). A minimum of 21 electrodes were placed on scalp according to the International 10-20 or 10-10 system. Supplemental electrodes were placed as needed. Single EKG electrode was also used to detect cardiac arrhythmia. Recording was performed at a sampling rate of at least 256 Hz. Patient's behavior was continuously recorded on video simultaneously with EEG. A minimum of 18 channels were used for data display. Each epoch of study was reviewed manually daily and as needed using standard digital review software allowing for montage reformatting, gain and filter changes on a display system of sufficient resolution to prevent aliasing. Computerized quantitative EEG analysis (such as compressed spectral array analysis, dipole analysis, trending, automated spike & seizure detection) was used as indicated.  Description of EEG features: State of patient: Coma  Background Activity Overall Amplitude: medium amplitude, symmetric Predominant Frequency:  There is a 9 Hz PDR that emerges with intermittent arousals. Diffuse 2-4 Hz background activity is seen during the rest of the recording.  Superimposed Frequencies: continuous medium amplitude (20-50 V) waxing and waning beta fast, bilateral and symmetric Reactivity to stimulation: present Asymmetry: No  Breach rhythm: No  Sleep:  No sleep is recorded.  Background abnormalities: Yes 1. Continuous slow, generalized  2. Excessive fast  Periodic or rhythmic abnormalities: No Epileptiform discharges: No Paroxysmal events or seizures: Yes PE type 1: Generalized motor event 12/23 @18 :17, 18:41, 19:30 and 20:27 Clinical: waxing and waning, stop and go dysrhythmic tremor like movements in the lower extremities, occasionally in the torso and shoulders and with rare grimacing EEG: typically more awake with the presence of a 9 Hz PDR, but no associated epileptiform discharges or ictal patterns  Push button events: Yes - with above events  EKG: 90 bpm and regular   Impression This EEG is indicative of: 1. A variable diffuse encephalopathy, with intermittent arousals to full wakefulness. Sedating medication effects are suspected.  2. Paroxysmal events which have no EEG correlate and are most consistent with non-epileptic spells.

## 2017-12-25 NOTE — Progress Notes (Signed)
NAME:  Clifford Mosley, MRN:  [id number], DOB:  09-06-1995, LOS: 1 ADMISSION DATE:  12/24/2017, CONSULTATION DATE:  12/23 REFERRING MD:  isacc, CHIEF COMPLAINT:  Seizure and possible sepsis   Brief History   22 year old male patient with history of autism, admitted 12/23 for status epilepticus, and possible sepsis with concern for CNS infection Intubated for airway protection, underwent lumbar puncture with no evidence of CNS infection.  Past Medical History  Autism, seizures. At baseline he is minimally verbal, he is able to speak in 1-4 word phrases, at approximately "13-year-old level" per his mother he is ambulatory, able to eat on his own.  He has significant behavioral problems, typically becomes combative when agitated.   Significant Hospital Events   12/23 admitted with working diagnosis of possible sepsis, concern for CNS infection, IV vancomycin and ceftriaxone started  Consults:  Neuro 12/23  Procedures:  OETT 12/23 LP 12/23.  WBC 3, protein 19, glucose 87  Significant Diagnostic Tests:  CT brain 12/23: neg  Micro Data:  BCX2 12/23>>> UC 12/23>>> Resp 12/23>> CSF 12/23>>>  Antimicrobials:  Vanc 12/23>>> Rocephin 12/23>>>   Interim history/subjective:    Objective   Blood pressure 111/75, pulse 91, temperature 97.8 F (36.6 C), temperature source Oral, resp. rate 16, height 5\' 7"  (1.702 m), weight 88.7 kg, SpO2 98 %.    Vent Mode: PRVC FiO2 (%):  [40 %-100 %] 40 % Set Rate:  [16 bmp] 16 bmp Vt Set:  [500 mL-520 mL] 520 mL PEEP:  [5 cmH20] 5 cmH20 Plateau Pressure:  [15 cmH20-21 cmH20] 18 cmH20   Intake/Output Summary (Last 24 hours) at 12/25/2017 1017 Last data filed at 12/25/2017 [id number] Gross per 24 hour  Intake 7175.01 ml  Output 475 ml  Net 6700.01 ml   Filed Weights   12/24/17 1319 12/25/17 0445  Weight: 90.7 kg 88.7 kg   Examination: Gen:      No acute distress HEENT:  EOMI, sclera anicteric ET tube Neck:     No masses; no thyromegaly Lungs:     Clear to auscultation bilaterally; normal respiratory effort CV:         Regular rate and rhythm; no murmurs Abd:      + bowel sounds; soft, non-tender; no palpable masses, no distension Ext:    No edema; adequate peripheral perfusion Skin:      Warm and dry; no rash Neuro: Sedated, unresponsive   Resolved Hospital Problem list     Assessment & Plan:  Status epilepticus Plan AEDs per neurology Continue sedation with propofol Continuous EEG monitoring MRI once the EEG leads are removed.  Acute metabolic encephalopathy, superimposed on underlying autism with behavioral problems Plan Serial neuro exam Supportive care  Severe sepsis/septic shock versus drug-related hypotension, with concern for CNS infection -CT brain negative -Spoke with infectious disease via phone Plan Continue Vanco, Rocephin Work-up so far does not show any evidence of infection. Will DC abx if cultures remain negative  Respiratory failure w/ need for airway protection s/p seizure  Plan Full vent support, VAP bundle.  Hypernatremia/ hyperosmolar -favor volume depletion  Plan IV fluids  Fluid and electrolyte imbalance: hypokalemia  Plan Replete potassium.  Best practice:  Diet: Start tube feeds Pain/Anxiety/Delirium protocol (if indicated): porpofol VAP protocol (if indicated): 12/23 DVT prophylaxis: PAS GI prophylaxis: PPI Glucose control: na Mobility: BR Code Status: Full Family Communication: Family updated at bedside Disposition: Critically ill requiring titration of anticonvulsants, continuous EEG monitoring, treatment for life-threatening infection, titration of mechanical ventilation, serial  neurologic checks, and intensive care monitoring.  He will be admitted to the neuro intensive care.  The patient is critically ill with multiple organ system failure and requires high complexity decision making for assessment and support, frequent evaluation and titration of therapies, advanced  monitoring, review of radiographic studies and interpretation of complex data.   Critical Care Time devoted to patient care services, exclusive of separately billable procedures, described in this note is 35 minutes.   Chilton GreathousePraveen Leea Rambeau MD [facility] Pager [phone] If no answer or after 3pm call: [phone] 12/25/2017, 10:31 AM

## 2017-12-25 NOTE — Progress Notes (Signed)
EEG maint complete. No skin breakdown. Continue to monitor 

## 2017-12-25 NOTE — Progress Notes (Signed)
Initial Nutrition Assessment  DOCUMENTATION CODES:   Not applicable  INTERVENTION:    Vital High Protein at 70 ml/h (1680 ml per day)  Provides 1680 kcal (2179 kcal total with Propofol), 147 gm protein, 1404 ml free water daily  NUTRITION DIAGNOSIS:   Inadequate oral intake related to inability to eat as evidenced by NPO status.  GOAL:   Patient will meet greater than or equal to 90% of their needs  MONITOR:   Vent status, TF tolerance, Labs, Skin, Weight trends  REASON FOR ASSESSMENT:   Ventilator, Consult Enteral/tube feeding initiation and management(verbal consult)  ASSESSMENT:   22 yo male with PMH of autism who was admitted with SIRS/sepsis syndrome and focal seizures. Required intubation on admission.   Patient's family reports that he had been eating poorly for 1-2 days PTA. Prior to that he had a good appetite and weight has been stable. He is not very active at home.   Spoke with CCM physician; okay to begin enteral nutrition. OGT in place.   Patient is currently intubated on ventilator support MV: 8.4 L/min Temp (24hrs), Avg:99.3 F (37.4 C), Min:97.8 F (36.6 C), Max:101.1 F (38.4 C)  Propofol: 18.9 ml/hr providing 499 kcal from lipid.   Labs reviewed. Sodium 149 (H), potassium 3.4 (L), phosphorus 2.3 (L) Medications reviewed and include neosynephrine, potassium phosphate, propofol.  I/O + 6.7 L since admission, suspect current weight may be a little higher than actual usual weight.   NUTRITION - FOCUSED PHYSICAL EXAM:    Most Recent Value  Orbital Region  No depletion  Upper Arm Region  No depletion  Thoracic and Lumbar Region  No depletion  Buccal Region  No depletion  Temple Region  No depletion  Clavicle Bone Region  No depletion  Clavicle and Acromion Bone Region  No depletion  Scapular Bone Region  Unable to assess  Dorsal Hand  No depletion  Patellar Region  No depletion  Anterior Thigh Region  No depletion  Posterior Calf Region   No depletion  Edema (RD Assessment)  None [mild facial and moderate perineal edema per RN documentation ]  Hair  Reviewed  Eyes  Unable to assess  Mouth  Unable to assess  Skin  Reviewed  Nails  Reviewed       Diet Order:   Diet Order            Diet NPO time specified  Diet effective now              EDUCATION NEEDS:   No education needs have been identified at this time  Skin:  Skin Assessment: Reviewed RN Assessment  Last BM:  12/23  Height:   Ht Readings from Last 1 Encounters:  12/24/17 5\' 7"  (1.702 m)    Weight:   Wt Readings from Last 1 Encounters:  12/25/17 88.7 kg    Ideal Body Weight:  67.3 kg  BMI:  Body mass index is 30.63 kg/m.  Estimated Nutritional Needs:   Kcal:  2200  Protein:  120-140 gm  Fluid:  2.2 L    Joaquin CourtsKimberly , RD, LDN, CNSC Pager [phone] After Hours Pager [phone]

## 2017-12-26 ENCOUNTER — Inpatient Hospital Stay (HOSPITAL_COMMUNITY): Payer: Medicaid Other

## 2017-12-26 DIAGNOSIS — G92 Toxic encephalopathy: Secondary | ICD-10-CM

## 2017-12-26 DIAGNOSIS — J9601 Acute respiratory failure with hypoxia: Secondary | ICD-10-CM

## 2017-12-26 LAB — GLUCOSE, CAPILLARY
GLUCOSE-CAPILLARY: 99 mg/dL (ref 70–99)
Glucose-Capillary: 101 mg/dL — ABNORMAL HIGH (ref 70–99)
Glucose-Capillary: 112 mg/dL — ABNORMAL HIGH (ref 70–99)
Glucose-Capillary: 112 mg/dL — ABNORMAL HIGH (ref 70–99)
Glucose-Capillary: 119 mg/dL — ABNORMAL HIGH (ref 70–99)
Glucose-Capillary: 122 mg/dL — ABNORMAL HIGH (ref 70–99)

## 2017-12-26 LAB — CBC
HCT: 32.7 % — ABNORMAL LOW (ref 39.0–52.0)
Hemoglobin: 10.6 g/dL — ABNORMAL LOW (ref 13.0–17.0)
MCH: 27.8 pg (ref 26.0–34.0)
MCHC: 32.4 g/dL (ref 30.0–36.0)
MCV: 85.8 fL (ref 80.0–100.0)
Platelets: 181 10*3/uL (ref 150–400)
RBC: 3.81 MIL/uL — ABNORMAL LOW (ref 4.22–5.81)
RDW: 12.6 % (ref 11.5–15.5)
WBC: 5.8 10*3/uL (ref 4.0–10.5)
nRBC: 0 % (ref 0.0–0.2)

## 2017-12-26 LAB — LEGIONELLA PNEUMOPHILA SEROGP 1 UR AG: L. pneumophila Serogp 1 Ur Ag: NEGATIVE

## 2017-12-26 LAB — PROCALCITONIN: Procalcitonin: <0.1 ng/mL

## 2017-12-26 LAB — BASIC METABOLIC PANEL
Anion gap: 11 (ref 5–15)
BUN: 6 mg/dL (ref 6–20)
CALCIUM: 8.6 mg/dL — AB (ref 8.9–10.3)
CO2: 26 mmol/L (ref 22–32)
Chloride: 107 mmol/L (ref 98–111)
Creatinine, Ser: 0.47 mg/dL — ABNORMAL LOW (ref 0.61–1.24)
GFR calc Af Amer: >60 mL/min (ref >60)
GFR calc non Af Amer: >60 mL/min (ref >60)
Glucose, Bld: 119 mg/dL — ABNORMAL HIGH (ref 70–99)
Potassium: 2.7 mmol/L — CL (ref 3.5–5.1)
Sodium: 144 mmol/L (ref 135–145)

## 2017-12-26 LAB — URINE CULTURE
CULTURE: NO GROWTH
Special Requests: NORMAL

## 2017-12-26 LAB — MAGNESIUM: Magnesium: 1.8 mg/dL (ref 1.7–2.4)

## 2017-12-26 LAB — PHOSPHORUS: Phosphorus: 2.9 mg/dL (ref 2.5–4.6)

## 2017-12-26 MED ORDER — GADOBUTROL 1 MMOL/ML IV SOLN
10.0000 mL | Freq: Once | INTRAVENOUS | Status: AC | PRN
  Administered 2017-12-26: 10 mL via INTRAVENOUS

## 2017-12-26 MED ORDER — FUROSEMIDE 10 MG/ML IJ SOLN
20.0000 mg | Freq: Once | INTRAMUSCULAR | Status: AC
  Administered 2017-12-26: 20 mg via INTRAVENOUS
  Filled 2017-12-26: qty 2

## 2017-12-26 MED ORDER — POTASSIUM CHLORIDE 10 MEQ/100ML IV SOLN
10.0000 meq | INTRAVENOUS | Status: AC
  Administered 2017-12-26 (×4): 10 meq via INTRAVENOUS
  Filled 2017-12-26 (×4): qty 100

## 2017-12-26 MED ORDER — POTASSIUM CHLORIDE 20 MEQ/15ML (10%) PO SOLN
40.0000 meq | Freq: Two times a day (BID) | ORAL | Status: AC
  Administered 2017-12-26 (×2): 40 meq
  Filled 2017-12-26 (×2): qty 30

## 2017-12-26 NOTE — Progress Notes (Signed)
LTM EEG discontinued per Dr Wilford CornerArora.  No skin breakdown noted.

## 2017-12-26 NOTE — Progress Notes (Signed)
Neurology Progress Note   S:// Patient seen and examined.  Continues to have both left and right-sided arm jerking movements with no EEG correlate.  Formal EEG report from overnight is pending at this time.  Remains on propofol 25 because on propofol was reduced the frequency of these movements increased.  Remains intubated. Mother concern for bloody urine.  O:// Current vital signs: BP 126/68   Pulse (!) 103   Temp 99 F (37.2 C) (Axillary)   Resp 16   Ht _0  (1.702 m)   Wt 92.1 kg   SpO2 99%   BMI 31.80 kg/m  Vital signs in last 24 hours: Temp:  [98 F (36.7 C)-100.1 F (37.8 C)] 99 F (37.2 C) (12/25 0807) Pulse Rate:  [85-107] 103 (12/25 0811) Resp:  [13-16] 16 (12/25 0811) BP: (100-137)/(58-88) 126/68 (12/25 0811) SpO2:  [97 %-100 %] 99 % (12/25 0811) FiO2 (%):  [40 %] 40 % (12/25 0811) Weight:  [92.1 kg] 92.1 kg (12/25 0300) General: Sedated intubated HEENT: Normocephalic atraumati CVS: O1-Y0 heard regular rate rhythm Respiratory: Vented Extremities: Warm well perfused with no edema Neurological exam Sedated intubated Spontaneously jerking right and left-sided upper extremities with no correlate on the continuous EEG monitor. Breathing over the ventilator after stimulation otherwise breathing with the ventilator. Pupils equal round reactive to light Corneals present Cough and gag present No significant withdrawal to noxious stimulation  Medications  Current Facility-Administered Medications:  .  0.9 %  sodium chloride infusion, 250 mL, Intravenous, Continuous, Babcock, Peter E, NP .  0.9 %  sodium chloride infusion, , Intravenous, PRN, Brand Males, MD, Last Rate: 10 mL/hr at 12/26/17 0800 .  acetaminophen (TYLENOL) solution 650 mg, 650 mg, Per Tube, Q4H PRN, Rigoberto Noel, MD .  cefTRIAXone (ROCEPHIN) 2 g in sodium chloride 0.9 % 100 mL IVPB, 2 g, Intravenous, Q24H, Mannam, Praveen, MD .  chlorhexidine gluconate (MEDLINE KIT) (PERIDEX) 0.12 % solution  15 mL, 15 mL, Mouth Rinse, BID, Ramaswamy, Murali, MD, 15 mL at 12/25/17 2003 .  feeding supplement (VITAL HIGH PROTEIN) liquid 1,000 mL, 1,000 mL, Per Tube, Q24H, Mannam, Praveen, MD, 1,000 mL at 12/26/17 0639 .  fentaNYL (SUBLIMAZE) injection 100 mcg, 100 mcg, Intravenous, Q15 min PRN, Erick Colace, NP .  fentaNYL (SUBLIMAZE) injection 100 mcg, 100 mcg, Intravenous, Q2H PRN, Erick Colace, NP, 100 mcg at 12/26/17 0245 .  lactated ringers infusion, , Intravenous, Continuous, Erick Colace, NP, Last Rate: 100 mL/hr at 12/26/17 0800 .  levETIRAcetam (KEPPRA) IVPB 500 mg/100 mL premix, 500 mg, Intravenous, Q12H, Erick Colace, NP, Stopped at 12/26/17 [phone]  LORazepam (ATIVAN) injection 1-2 mg, 1-2 mg, Intravenous, Q1H PRN, Rigoberto Noel, MD, 2 mg at 12/24/17 2238 .  MEDLINE mouth rinse, 15 mL, Mouth Rinse, 10 times per day, Brand Males, MD, 15 mL at 12/26/17 0519 .  pantoprazole (PROTONIX) injection 40 mg, 40 mg, Intravenous, Daily, Erick Colace, NP, 40 mg at 12/25/17 0958 .  phenylephrine (NEOSYNEPHRINE) 10-0.9 MG/250ML-% infusion, 25-200 mcg/min, Intravenous, Titrated, Erick Colace, NP, Stopped at 12/25/17 [phone]  phenytoin (DILANTIN) injection 100 mg, 100 mg, Intravenous, Q8H, Aroor, Karena Addison R, MD, 100 mg at 12/26/17 0516 .  propofol (DIPRIVAN) 1000 MG/100ML infusion, 0-50 mcg/kg/min, Intravenous, Continuous, Erick Colace, NP, Last Rate: 13.5 mL/hr at 12/26/17 0800, 25 mcg/kg/min at 12/26/17 0800 .  risperiDONE (RISPERDAL) tablet 3 mg, 3 mg, Per Tube, BID, Mannam, Praveen, MD, 3 mg at 12/25/17 2322 .  vancomycin (  VANCOCIN) IVPB 1000 mg/200 mL premix, 1,000 mg, Intravenous, Q12H, Mannam, Praveen, MD, Stopped at 12/25/17 2050 Labs CBC    Component Value Date/Time   WBC 8.8 12/25/2017 0349   RBC 4.35 12/25/2017 0349   HGB 12.2 (L) 12/25/2017 0349   HCT 38.3 (L) 12/25/2017 0349   PLT 259 12/25/2017 0349   MCV 88.0 12/25/2017 0349   MCH 28.0 12/25/2017 0349    MCHC 31.9 12/25/2017 0349   RDW 12.9 12/25/2017 0349   LYMPHSABS 1.2 12/24/2017 1245   MONOABS 1.1 (H) 12/24/2017 1245   EOSABS 0.0 12/24/2017 1245   BASOSABS 0.0 12/24/2017 1245    CMP     Component Value Date/Time   NA 149 (H) 12/25/2017 0349   K 3.4 (L) 12/25/2017 0349   CL 113 (H) 12/25/2017 0349   CO2 22 12/25/2017 0349   GLUCOSE 114 (H) 12/25/2017 0349   BUN 9 12/25/2017 0349   CREATININE 0.80 12/25/2017 0349   CALCIUM 9.3 12/25/2017 0349   PROT 7.2 12/24/2017 1245   ALBUMIN 3.8 12/24/2017 1245   AST 42 (H) 12/24/2017 1245   ALT 38 12/24/2017 1245   ALKPHOS 96 12/24/2017 1245   BILITOT 1.7 (H) 12/24/2017 1245   GFRNONAA >60 12/25/2017 0349   GFRAA >60 12/25/2017 0349  No new brain imaging to review at this time  Assessment: 22 year old male past history of autism, 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  Spinal tap was not concerning for CNS infection He continues to have some metabolic arrangements yesterday which are  stil presentl including hypernatremia, hypokalemia, hypophosphatemia which might be contributing to some of his altered mental status but his arm jerking movements over the 24-hour.  EEG have been nonepileptic. Formal reading from the last 24 hours is pending at this time from the long-term EEG I suspect that this is toxic metabolic encephalopathy and some of these tremulous movements are either secondary to metabolic derangements or side effects of Risperdal as he has some baseline tremors with fine movement due to side effect of medications  Recommendations: If the continuous EEG over the past 24 hours did not show any evidence of electrographic seizures, continuous EEG can be discontinued Continue him on Keppra and Dilantin at current doses for right now If there is any evidence of  seizures on the EEG, can increase the dose of Keppra We will try to minimize sedation if there is no evidence of seizures on the EEG and switch over propofol to maybe Precedex to facilitate extubation Management of hematuria per primary team as you are  Discussed plan with the mother and PCCM attending Dr. Vaughan Browner on the unit.  We will continue to follow with you.  -- Amie Portland, MD [facility] Pager: [phone] If 7pm to 7am, please call on call as listed on AMION.  CRITICAL CARE ATTESTATION Performed by: Amie Portland, MD Total critical care time: 30 minutes Critical care time was exclusive of separately billable procedures and treating other patients and/or supervising APPs/Residents/Students Critical care was necessary to treat or prevent imminent or life-threatening deterioration due to toxic metabolic encephalopathy, concern for seizures This patient is critically ill and at significant risk for neurological worsening and/or death and care requires constant monitoring. Critical care was time spent personally by me on the following activities: development of treatment  plan with patient and/or surrogate as well as nursing, discussions with consultants, evaluation of patient's response to treatment, examination of patient, obtaining history from patient or surrogate, ordering and performing treatments and interventions, ordering and review of laboratory studies, ordering and review of radiographic studies, pulse oximetry, re-evaluation of patient's condition, participation in multidisciplinary rounds and medical decision making of high complexity in the care of this patient.

## 2017-12-26 NOTE — Procedures (Addendum)
   [facility] __________________  Video EEG Monitoring Report   CPT/Type of Study: [id number]; 24hr EEG with video Recording dates: 12/25/17 @07 :30 to 12/2517 @07 :30 Recording day: 1 (started on 12/23) Requesting provider: Katrinka BlazingSmith Interpreting physician: Delbert Phenixan-Andrei Izza Bickle, MD  Indications for Procedure: Breakthrough seizures Primary neurological diagnosis: Epilepsy  History: This is an 22 year old patient, undergoing continuous EEG to evaluate for seizures.   EEG Details: Continuous video EEG was performed using standard setting per the guidelines of American Clinical Neurophysiology Society (ACNS). A minimum of 21 electrodes were placed on scalp according to the International 10-20 or 10-10 system. Supplemental electrodes were placed as needed. Single EKG electrode was also used to detect cardiac arrhythmia. Recording was performed at a sampling rate of at least 256 Hz. Patient's behavior was continuously recorded on video simultaneously with EEG. A minimum of 18 channels were used for data display. Each epoch of study was reviewed manually daily and as needed using standard digital review software allowing for montage reformatting, gain and filter changes on a display system of sufficient resolution to prevent aliasing. Computerized quantitative EEG analysis (such as compressed spectral array analysis, dipole analysis, trending, automated spike & seizure detection) was used as indicated.  Description of EEG features: State of patient: Coma  Background Activity Overall Amplitude: medium amplitude, symmetric Predominant Frequency:  There is a 9 Hz PDR that emerges with intermittent arousals. Diffuse 2-4 Hz background activity is seen during the rest of the recording.  Superimposed Frequencies: continuous medium amplitude (20-50 V) waxing and waning beta fast, bilateral and symmetric Reactivity to stimulation: present Asymmetry: No  Breach rhythm: No  Sleep:  No sleep is recorded.  Background abnormalities: Yes 1. Continuous slow, generalized  2. Excessive fast  Periodic or rhythmic abnormalities: No Epileptiform discharges: No Paroxysmal events or seizures: Yes PE type 1: Generalized motor event 12/24 @14 :54, 16:25 Clinical: waxing and waning, stop and go dysrhythmic tremor like movements primarily in the left upper extremities, but also torso/head. EEG: partial arousal pattern, but no associated epileptiform discharges or ictal patterns  Push button events: Yes - with above events  EKG: 90 bpm and regular   Impression This EEG is indicative of: 1. A variable diffuse encephalopathy, with intermittent arousals to full wakefulness. Sedating medication effects are suspected.  2. Paroxysmal events which have no EEG correlate and are most consistent with non-epileptic spells.

## 2017-12-26 NOTE — Progress Notes (Signed)
Patient's urine this AM is bloody in appearance. Will continue to monitor

## 2017-12-26 NOTE — Progress Notes (Signed)
CRITICAL VALUE ALERT  Critical Value:  K+2.7  Date & Time Notied:  12/26/17 12:22 PM   Provider Notified: Dr. Isaiah SergeMannam  Orders Received/Actions taken: Electrolyte replacement

## 2017-12-26 NOTE — Progress Notes (Signed)
Overnight EEG with no EEG correlate. No ictal patterns Continuous EEG can be discontinued MRI can be obtained, preferably when the patient still intubated and sedated Sedation can then be lowered I will inform the primary team  -- Milon DikesAshish Hosteen Kienast, MD [facility] Pager: [phone] If 7pm to 7am, please call on call as listed on AMION.

## 2017-12-26 NOTE — Progress Notes (Signed)
NAME:  Lorayne BenderDavid Legere, MRN:  [id number], DOB:  1995/03/31, LOS: 2 ADMISSION DATE:  12/24/2017, CONSULTATION DATE:  12/23 REFERRING MD:  isacc, CHIEF COMPLAINT:  Seizure and possible sepsis   Brief History   22 year old male patient with history of autism, admitted 12/23 for status epilepticus, and possible sepsis with concern for CNS infection Intubated for airway protection, underwent lumbar puncture with no evidence of CNS infection.  Past Medical History  Autism, seizures. At baseline he is minimally verbal, he is able to speak in 1-4 word phrases, at approximately "21-year-old level" per his mother he is ambulatory, able to eat on his own.  He has significant behavioral problems, typically becomes combative when agitated.   Significant Hospital Events   12/23 admitted with working diagnosis of possible sepsis, concern for CNS infection, IV vancomycin and ceftriaxone started  Consults:  Neuro 12/23  Procedures:  OETT 12/23 LP 12/23.  WBC 3, protein 19, glucose 87  Significant Diagnostic Tests:  CT brain 12/23: neg  Micro Data:  BCX2 12/23>>> UC 12/23>>> Resp 12/23>> CSF 12/23>>>  Antimicrobials:  Vanc 12/23>> 12/25 Rocephin 12/23>> 12/25  Interim history/subjective:  Remains sedated, ventilated and on EEG  Objective   Blood pressure 120/71, pulse (!) 101, temperature 99 F (37.2 C), temperature source Axillary, resp. rate 16, height 5\' 7"  (1.702 m), weight 92.1 kg, SpO2 98 %.    Vent Mode: PRVC FiO2 (%):  [40 %] 40 % Set Rate:  [16 bmp] 16 bmp Vt Set:  [520 mL] 520 mL PEEP:  [5 cmH20] 5 cmH20 Plateau Pressure:  [17 cmH20-18 cmH20] 17 cmH20   Intake/Output Summary (Last 24 hours) at 12/26/2017 1018 Last data filed at 12/26/2017 1000 Gross per 24 hour  Intake 4280.05 ml  Output 1540 ml  Net 2740.05 ml   Filed Weights   12/24/17 1319 12/25/17 0445 12/26/17 0300  Weight: 90.7 kg 88.7 kg 92.1 kg   Examination: Gen:      No acute distress HEENT:  EOMI, sclera  anicteric, ET tube Neck:     No masses; no thyromegaly Lungs:    Clear to auscultation bilaterally; normal respiratory effort CV:         Regular rate and rhythm; no murmurs Abd:      + bowel sounds; soft, non-tender; no palpable masses, no distension Ext:    No edema; adequate peripheral perfusion Skin:      Warm and dry; no rash Neuro: Sedated, unresponsive   Resolved Hospital Problem list     Assessment & Plan:  Status epilepticus Plan AEDs per neurology Continue sedation with propofol Plan on removing EEG leads today MRI once the EEG leads are removed. Discussed with neurology Dr. Jerrell BelfastAurora.  Acute metabolic encephalopathy, superimposed on underlying autism with behavioral problems Plan Serial neuro exam Supportive care  Severe sepsis/septic shock versus drug-related hypotension, with concern for CNS infection -CT brain negative -Spoke with infectious disease via phone Plan Stop antibiotics as there is no evidence of infection.  Respiratory failure w/ need for airway protection s/p seizure  Plan Full vent support, VAP bundle. One dose lasix today  Best practice:  Diet: Tube feeds Pain/Anxiety/Delirium protocol (if indicated): propofol VAP protocol (if indicated): 12/23 DVT prophylaxis: PAS GI prophylaxis: PPI Glucose control: na Mobility: BR Code Status: Full Family Communication: Family updated at bedside Disposition: ICU  The patient is critically ill with multiple organ system failure and requires high complexity decision making for assessment and support, frequent evaluation and titration of therapies, advanced  monitoring, review of radiographic studies and interpretation of complex data.   Critical Care Time devoted to patient care services, exclusive of separately billable procedures, described in this note is 35 minutes.   Chilton GreathousePraveen Tula Schryver MD [facility] Pager [phone] If no answer or after 3pm call: [phone] 12/26/2017, 10:26  AM

## 2017-12-27 ENCOUNTER — Encounter (HOSPITAL_COMMUNITY): Payer: Self-pay

## 2017-12-27 ENCOUNTER — Inpatient Hospital Stay (HOSPITAL_COMMUNITY): Payer: Medicaid Other

## 2017-12-27 ENCOUNTER — Other Ambulatory Visit: Payer: Self-pay

## 2017-12-27 LAB — GLUCOSE, CAPILLARY
GLUCOSE-CAPILLARY: 87 mg/dL (ref 70–99)
Glucose-Capillary: 106 mg/dL — ABNORMAL HIGH (ref 70–99)
Glucose-Capillary: 108 mg/dL — ABNORMAL HIGH (ref 70–99)
Glucose-Capillary: 122 mg/dL — ABNORMAL HIGH (ref 70–99)
Glucose-Capillary: 124 mg/dL — ABNORMAL HIGH (ref 70–99)
Glucose-Capillary: 92 mg/dL (ref 70–99)

## 2017-12-27 LAB — BASIC METABOLIC PANEL
Anion gap: 8 (ref 5–15)
BUN: 9 mg/dL (ref 6–20)
CHLORIDE: 107 mmol/L (ref 98–111)
CO2: 27 mmol/L (ref 22–32)
Calcium: 8.8 mg/dL — ABNORMAL LOW (ref 8.9–10.3)
Creatinine, Ser: 0.49 mg/dL — ABNORMAL LOW (ref 0.61–1.24)
GFR calc Af Amer: >60 mL/min (ref >60)
GFR calc non Af Amer: >60 mL/min (ref >60)
GLUCOSE: 103 mg/dL — AB (ref 70–99)
Potassium: 3.3 mmol/L — ABNORMAL LOW (ref 3.5–5.1)
Sodium: 142 mmol/L (ref 135–145)

## 2017-12-27 LAB — CBC
HEMATOCRIT: 35.1 % — AB (ref 39.0–52.0)
Hemoglobin: 11.4 g/dL — ABNORMAL LOW (ref 13.0–17.0)
MCH: 28.3 pg (ref 26.0–34.0)
MCHC: 32.5 g/dL (ref 30.0–36.0)
MCV: 87.1 fL (ref 80.0–100.0)
Platelets: 148 10*3/uL — ABNORMAL LOW (ref 150–400)
RBC: 4.03 MIL/uL — ABNORMAL LOW (ref 4.22–5.81)
RDW: 12.8 % (ref 11.5–15.5)
WBC: 5 10*3/uL (ref 4.0–10.5)
nRBC: 0 % (ref 0.0–0.2)

## 2017-12-27 LAB — PHOSPHORUS: Phosphorus: 3.1 mg/dL (ref 2.5–4.6)

## 2017-12-27 LAB — MAGNESIUM: Magnesium: 1.8 mg/dL (ref 1.7–2.4)

## 2017-12-27 LAB — MYOGLOBIN, URINE: Myoglobin, Ur: <2 ng/mL (ref 0–13)

## 2017-12-27 LAB — CSF CULTURE W GRAM STAIN
Culture: NO GROWTH
Special Requests: NORMAL

## 2017-12-27 LAB — TRIGLYCERIDES: Triglycerides: 217 mg/dL — ABNORMAL HIGH (ref <150)

## 2017-12-27 MED ORDER — MAGNESIUM SULFATE 2 GM/50ML IV SOLN
2.0000 g | Freq: Once | INTRAVENOUS | Status: AC
  Administered 2017-12-27: 2 g via INTRAVENOUS
  Filled 2017-12-27: qty 50

## 2017-12-27 MED ORDER — RISPERIDONE 2 MG PO TBDP
3.0000 mg | ORAL_TABLET | Freq: Two times a day (BID) | ORAL | Status: DC
  Administered 2017-12-28 – 2017-12-30 (×5): 3 mg via ORAL
  Filled 2017-12-27 (×7): qty 1

## 2017-12-27 MED ORDER — POTASSIUM CHLORIDE 20 MEQ/15ML (10%) PO SOLN
40.0000 meq | Freq: Two times a day (BID) | ORAL | Status: DC
  Administered 2017-12-27: 40 meq via ORAL
  Filled 2017-12-27: qty 30

## 2017-12-27 MED ORDER — ENOXAPARIN SODIUM 40 MG/0.4ML ~~LOC~~ SOLN
40.0000 mg | SUBCUTANEOUS | Status: DC
  Administered 2017-12-27 – 2017-12-31 (×5): 40 mg via SUBCUTANEOUS
  Filled 2017-12-27 (×5): qty 0.4

## 2017-12-27 MED ORDER — POTASSIUM CHLORIDE 10 MEQ/100ML IV SOLN
10.0000 meq | INTRAVENOUS | Status: AC
  Administered 2017-12-27 (×4): 10 meq via INTRAVENOUS
  Filled 2017-12-27 (×4): qty 100

## 2017-12-27 MED ORDER — MIDAZOLAM HCL 2 MG/2ML IJ SOLN
2.0000 mg | INTRAMUSCULAR | Status: DC | PRN
  Administered 2017-12-27 – 2017-12-29 (×5): 2 mg via INTRAVENOUS
  Filled 2017-12-27 (×5): qty 2

## 2017-12-27 MED ORDER — FENTANYL CITRATE (PF) 100 MCG/2ML IJ SOLN
12.5000 ug | INTRAMUSCULAR | Status: DC | PRN
  Administered 2017-12-27 – 2017-12-30 (×5): 12.5 ug via INTRAVENOUS
  Filled 2017-12-27 (×7): qty 2

## 2017-12-27 MED ORDER — POTASSIUM CHLORIDE 10 MEQ/100ML IV SOLN
10.0000 meq | INTRAVENOUS | Status: AC
  Administered 2017-12-27 – 2017-12-28 (×4): 10 meq via INTRAVENOUS
  Filled 2017-12-27 (×4): qty 100

## 2017-12-27 MED ORDER — ORAL CARE MOUTH RINSE
15.0000 mL | Freq: Two times a day (BID) | OROMUCOSAL | Status: DC
  Administered 2017-12-27 – 2017-12-29 (×5): 15 mL via OROMUCOSAL

## 2017-12-27 MED ORDER — FUROSEMIDE 10 MG/ML IJ SOLN
40.0000 mg | Freq: Two times a day (BID) | INTRAMUSCULAR | Status: AC
  Administered 2017-12-27 (×2): 40 mg via INTRAVENOUS
  Filled 2017-12-27 (×2): qty 4

## 2017-12-27 NOTE — Progress Notes (Signed)
Patient transported to MRI 

## 2017-12-27 NOTE — Progress Notes (Signed)
Pt in MRI.

## 2017-12-27 NOTE — Progress Notes (Signed)
NAME:  Clifford Mosley, MRN:  [id number], DOB:  Feb 24, 1995, LOS: 3 ADMISSION DATE:  12/24/2017, CONSULTATION DATE:  12/23 REFERRING MD:  Clifford Mosley, CHIEF COMPLAINT:  Seizure and possible sepsis   Brief History   22 year old male patient with history of autism, admitted 12/23 for status epilepticus, and possible sepsis with concern for CNS infection Intubated for airway protection, underwent lumbar puncture with no evidence of CNS infection.  Past Medical History  Autism, seizures. At baseline he is minimally verbal, he is able to speak in 1-4 word phrases, at approximately "89-year-old level" per his mother he is ambulatory, able to eat on his own.  He has significant behavioral problems, typically becomes combative when agitated.   Significant Hospital Events   12/23 admitted with working diagnosis of possible sepsis, concern for CNS infection, IV vancomycin and ceftriaxone started   Consults:  Neuro 12/23  Procedures:  OETT 12/23 LP 12/23.  WBC 3, protein 19, glucose 87  Significant Diagnostic Tests:  CT brain 12/23: neg MRI head 12/24-no acute abnormality.  X-ray 12/27/2017-lungs are clear.  I have reviewed the images personally.  Micro Data:  BCX2 12/23>>>neg UC 12/23>>> neg CSF 12/23>>> neg  Antimicrobials:  Vanc 12/23>> 12/25 Rocephin 12/23>> 12/25  Interim history/subjective:    Objective   Blood pressure 125/69, pulse (!) 103, temperature 97.9 F (36.6 C), temperature source Oral, resp. rate 16, height 5\' 7"  (1.702 m), weight 90.2 kg, SpO2 99 %.    Vent Mode: PRVC FiO2 (%):  [40 %] 40 % Set Rate:  [16 bmp] 16 bmp Vt Set:  [520 mL] 520 mL PEEP:  [5 cmH20] 5 cmH20 Plateau Pressure:  [17 cmH20-18 cmH20] 18 cmH20   Intake/Output Summary (Last 24 hours) at 12/27/2017 0906 Last data filed at 12/27/2017 0800 Gross per 24 hour  Intake 2772.08 ml  Output 3650 ml  Net -877.92 ml   Filed Weights   12/25/17 0445 12/26/17 0300 12/27/17 0500  Weight: 88.7 kg 92.1 kg  90.2 kg   Examination: Gen:      No acute distress HEENT:  EOMI, sclera anicteric, ET tube Neck:     No masses; no thyromegaly Lungs:    Clear to auscultation bilaterally; normal respiratory effort CV:         Regular rate and rhythm; no murmurs Abd:      + bowel sounds; soft, non-tender; no palpable masses, no distension Ext:    No edema; adequate peripheral perfusion Skin:      Warm and dry; no rash Neuro: Sedated, unresponsive.   Resolved Hospital Problem list     Assessment & Plan:  Status epilepticus Acute metabolic encephalopathy, superimposed on underlying autism with behavioral problems Plan AEDs per neurology.  Long-term EEG discontinued. Serial neuro exam MRI is normal  Drug-related hypotension, with concern for CNS infection -CT brain negative Plan Off antibiotics as there is no evidence of infection.  Respiratory failure w/ need for airway protection s/p seizure  Plan Wean to extubate Give Lasix as he appears volume overloaded Replete potassium  Best practice:  Diet: Tube feeds Pain/Anxiety/Delirium protocol (if indicated): propofol VAP protocol (if indicated): 12/23 DVT prophylaxis: Lovenox GI prophylaxis: PPI Glucose control: na Mobility: BR Code Status: Full Family Communication: Mother updated at bedside. Disposition: ICU  The patient is critically ill with multiple organ system failure and requires high complexity decision making for assessment and support, frequent evaluation and titration of therapies, advanced monitoring, review of radiographic studies and interpretation of complex data.   Critical  Care Time devoted to patient care services, exclusive of separately billable procedures, described in this note is 35 minutes.   Clifford GreathousePraveen Tiauna Whisnant MD  Pulmonary and Critical Care Pager [phone] If no answer or after 3pm call: [phone] 12/27/2017, 9:06 AM

## 2017-12-27 NOTE — Procedures (Signed)
Extubation Procedure Note  Patient Details:   Name: Clifford Mosley DOB: 12/19/1995 MRN: [id number]   Airway Documentation:    Vent end date: 12/27/17 Vent end time: 0939   Evaluation  O2 sats: stable throughout Complications: No apparent complications Patient did tolerate procedure well. Bilateral Breath Sounds: Clear   No  Merlene Laughteratalie  [name] 12/27/2017, 9:43 AM  Pt is autistic and didn't speak post extubation, however he does have good cough and followed commands. Pt is stable on 4 L [place].

## 2017-12-27 NOTE — Progress Notes (Addendum)
Subjective: MRI negative  Exam: Vitals:   12/27/17 0700 12/27/17 0733  BP: 133/88   Pulse: (!) 105   Resp: 16   Temp:  97.9 F (36.6 C)  SpO2: 99%    Gen: In bed, intubated Resp: ventilated Abd: soft, nt  Sedated Neuro: MS: does not open eyes [id number]:PERRL, face symmetric Motor: minimal withdrawal to nox stim x 4.  Sensory:as above.   Pertinent Labs: CSF 12/23  WBC 1 RBC 1 Protein 19 Glucose 87  Mg 12/26 1.8 Na 142 Cr 0.49  No leukocytosis  Impression: 22 yo M with autisim and seizure disorder who presented with breakthrough seizure presetnign as status epilepticus. He continues to have paroxysmal events which have not been found to have EEG correlate. With negative MRI and prolonged negative EEG, would continue working towards extubation.   The movements could be unmasked EPS due to risperidone, now reinstated.   Recommendations: 1) Hold adderall 2) continue keppra, dilantin. Likely will not need long term dual therapy, but would continue this for now.  3) will follow.   Ritta SlotMcNeill Sunny Aguon, MD [facility] [phone]  If 7pm- 7am, please page neurology on call as listed in AMION.

## 2017-12-27 NOTE — Progress Notes (Signed)
Patient transported to MRI and back to [id number] without any complications.

## 2017-12-28 ENCOUNTER — Inpatient Hospital Stay: Payer: Self-pay

## 2017-12-28 ENCOUNTER — Inpatient Hospital Stay (HOSPITAL_COMMUNITY): Payer: Medicaid Other

## 2017-12-28 DIAGNOSIS — R509 Fever, unspecified: Secondary | ICD-10-CM

## 2017-12-28 DIAGNOSIS — R251 Tremor, unspecified: Secondary | ICD-10-CM

## 2017-12-28 DIAGNOSIS — F840 Autistic disorder: Secondary | ICD-10-CM

## 2017-12-28 DIAGNOSIS — R51 Headache: Secondary | ICD-10-CM

## 2017-12-28 DIAGNOSIS — G252 Other specified forms of tremor: Secondary | ICD-10-CM

## 2017-12-28 DIAGNOSIS — R519 Headache, unspecified: Secondary | ICD-10-CM

## 2017-12-28 DIAGNOSIS — G2589 Other specified extrapyramidal and movement disorders: Secondary | ICD-10-CM

## 2017-12-28 LAB — CBC
HCT: 40.9 % (ref 39.0–52.0)
Hemoglobin: 13.6 g/dL (ref 13.0–17.0)
MCH: 28.3 pg (ref 26.0–34.0)
MCHC: 33.3 g/dL (ref 30.0–36.0)
MCV: 85.2 fL (ref 80.0–100.0)
Platelets: 253 10*3/uL (ref 150–400)
RBC: 4.8 MIL/uL (ref 4.22–5.81)
RDW: 12.6 % (ref 11.5–15.5)
WBC: 8.4 10*3/uL (ref 4.0–10.5)
nRBC: 0 % (ref 0.0–0.2)

## 2017-12-28 LAB — BASIC METABOLIC PANEL
Anion gap: 12 (ref 5–15)
BUN: 7 mg/dL (ref 6–20)
CALCIUM: 9.5 mg/dL (ref 8.9–10.3)
CO2: 24 mmol/L (ref 22–32)
Chloride: 101 mmol/L (ref 98–111)
Creatinine, Ser: 0.56 mg/dL — ABNORMAL LOW (ref 0.61–1.24)
GFR calc Af Amer: >60 mL/min (ref >60)
GFR calc non Af Amer: >60 mL/min (ref >60)
Glucose, Bld: 102 mg/dL — ABNORMAL HIGH (ref 70–99)
Potassium: 3.5 mmol/L (ref 3.5–5.1)
Sodium: 137 mmol/L (ref 135–145)

## 2017-12-28 LAB — MAGNESIUM: Magnesium: 2 mg/dL (ref 1.7–2.4)

## 2017-12-28 LAB — HEPATIC FUNCTION PANEL
ALT: 140 U/L — ABNORMAL HIGH (ref 0–44)
AST: 135 U/L — ABNORMAL HIGH (ref 15–41)
Albumin: 3.1 g/dL — ABNORMAL LOW (ref 3.5–5.0)
Alkaline Phosphatase: 114 U/L (ref 38–126)
Bilirubin, Direct: 0.4 mg/dL — ABNORMAL HIGH (ref 0.0–0.2)
Indirect Bilirubin: 0.9 mg/dL (ref 0.3–0.9)
Total Bilirubin: 1.3 mg/dL — ABNORMAL HIGH (ref 0.3–1.2)
Total Protein: 6.6 g/dL (ref 6.5–8.1)

## 2017-12-28 LAB — GLUCOSE, CAPILLARY
Glucose-Capillary: 109 mg/dL — ABNORMAL HIGH (ref 70–99)
Glucose-Capillary: 93 mg/dL (ref 70–99)
Glucose-Capillary: 97 mg/dL (ref 70–99)
Glucose-Capillary: 91 mg/dL (ref 70–99)
Glucose-Capillary: 100 mg/dL — ABNORMAL HIGH (ref 70–99)
Glucose-Capillary: 84 mg/dL (ref 70–99)

## 2017-12-28 LAB — PHOSPHORUS: Phosphorus: 2.9 mg/dL (ref 2.5–4.6)

## 2017-12-28 MED ORDER — ZOLPIDEM TARTRATE 5 MG PO TABS: 5.0000 mg | ORAL_TABLET | Freq: Every evening | ORAL | Status: DC | PRN

## 2017-12-28 MED ORDER — SODIUM CHLORIDE 0.9% FLUSH
10.0000 mL | Freq: Two times a day (BID) | INTRAVENOUS | Status: DC
  Administered 2017-12-28: 20 mL
  Administered 2017-12-28 – 2017-12-29 (×2): 10 mL

## 2017-12-28 MED ORDER — MIDAZOLAM HCL 2 MG/2ML IJ SOLN
2.0000 mg | Freq: Once | INTRAMUSCULAR | Status: AC
  Administered 2017-12-28: 2 mg via INTRAMUSCULAR
  Filled 2017-12-28: qty 2

## 2017-12-28 MED ORDER — MIRTAZAPINE 15 MG PO TBDP
15.0000 mg | ORAL_TABLET | Freq: Every day | ORAL | Status: DC
  Administered 2017-12-28 – 2017-12-30 (×3): 15 mg via ORAL
  Filled 2017-12-28 (×5): qty 1

## 2017-12-28 MED ORDER — ACETAMINOPHEN 650 MG RE SUPP
650.0000 mg | RECTAL | Status: DC | PRN
  Administered 2017-12-28: 650 mg via RECTAL
  Filled 2017-12-28: qty 1

## 2017-12-28 MED ORDER — OXCARBAZEPINE 300 MG PO TABS
300.0000 mg | ORAL_TABLET | Freq: Two times a day (BID) | ORAL | Status: DC
  Administered 2017-12-29 – 2017-12-31 (×5): 300 mg via ORAL
  Filled 2017-12-28 (×7): qty 1

## 2017-12-28 MED ORDER — SODIUM CHLORIDE 0.9% FLUSH: 10.0000 mL | INTRAVENOUS | Status: DC | PRN

## 2017-12-28 MED ORDER — CLONAZEPAM 0.5 MG PO TBDP
0.5000 mg | ORAL_TABLET | Freq: Two times a day (BID) | ORAL | Status: DC
  Administered 2017-12-28 – 2017-12-31 (×6): 0.5 mg via ORAL
  Filled 2017-12-28 (×6): qty 1

## 2017-12-28 MED ORDER — LACTATED RINGERS IV SOLN
INTRAVENOUS | Status: DC
  Administered 2017-12-28 – 2017-12-29 (×2): via INTRAVENOUS

## 2017-12-28 NOTE — Progress Notes (Signed)
VAST RN to pt's bedside regarding consult for PIV placement. Pt has had 4 PIV's in 4 days. He is currently receiving Dilantin every 8 hrs along with Keppra every 12 hrs, both IV administration. VAST RN who previously stuck pt stated he needs a line.  Spoke with physician in unit regarding need for midline or PICC if he is to continue receiving these meds IV.  Per unit RN, speech therapy is to come this am to evaluate swallowing. Physician asked that swallow study be performed to determine if meds can be changed to PO administration before further IV access obtained.

## 2017-12-28 NOTE — Evaluation (Signed)
Clinical/Bedside Swallow Evaluation Patient Details  Name: Clifford Mosley MRN: [id number] Date of Birth: March 18, 1995  Today's Date: 12/28/2017 Time: SLP Start Time (ACUTE ONLY): 0913 SLP Stop Time (ACUTE ONLY): 0929 SLP Time Calculation (min) (ACUTE ONLY): 16 min  Past Medical History:  Past Medical History:  Diagnosis Date  . Autism    Past Surgical History:  Past Surgical History:  Procedure Laterality Date  . no known allergies     HPI:  Pt is a 22 year old male patient admitted with breakthrough seizure, presenting as status epilepticus and requiring ETT 12/23-12/26. MRI and LP negative. PMH: autism with behavioral problems, seizures   Assessment / Plan / Recommendation Clinical Impression  Pt has immediate, strong coughing that follows ice chip trials, concerning for reduced airway protection after extubation. Although he does not verbalize, he had few spontaneous vocalizations that did sound mildly hoarse. Hopeful for a quick turnaround given young age and no h/o dysphagia, but he is not yet ready for POs today. SLP will f/u for readiness. SLP Visit Diagnosis: Dysphagia, unspecified (R13.10)    Aspiration Risk  Moderate aspiration risk    Diet Recommendation NPO   Medication Administration: Via alternative means    Other  Recommendations Oral Care Recommendations: Oral care QID Other Recommendations: Have oral suction available   Follow up Recommendations (tba)      Frequency and Duration min 2x/week  2 weeks       Prognosis Prognosis for Safe Diet Advancement: Good      Swallow Study   General HPI: Pt is a 22 year old male patient admitted with breakthrough seizure, presenting as status epilepticus and requiring ETT 12/23-12/26. MRI and LP negative. PMH: autism with behavioral problems, seizures Type of Study: Bedside Swallow Evaluation Previous Swallow Assessment: none in chart Diet Prior to this Study: NPO Temperature Spikes Noted: No Respiratory Status:  Room air History of Recent Intubation: Yes Length of Intubations (days): 4 days Date extubated: 12/27/17 Behavior/Cognition: Alert;Cooperative Oral Cavity Assessment: Within Functional Limits Oral Care Completed by SLP: Recent completion by staff Patient Positioning: Upright in bed Baseline Vocal Quality: Hoarse(during vocalizations - otherwise nonverbal) Volitional Cough: Weak(mild) Volitional Swallow: Able to elicit    Oral/Motor/Sensory Function Overall Oral Motor/Sensory Function: Generalized oral weakness   Ice Chips Ice chips: Impaired Presentation: Spoon Pharyngeal Phase Impairments: Cough - Immediate   Thin Liquid Thin Liquid: Not tested    Nectar Thick Nectar Thick Liquid: Not tested   Honey Thick Honey Thick Liquid: Not tested   Puree Puree: Not tested   Solid     Solid: Not tested      Clifford Mosley, Orva Gwaltney 12/28/2017,10:18 AM  Clifford HamLaura Mosley, M.A. CCC-SLP Acute Herbalistehabilitation Services Pager [phone] Office [phone]

## 2017-12-28 NOTE — Progress Notes (Signed)
Nutrition Follow-up  INTERVENTION:   - Will monitor for diet advancement and supplement as appropriate  - If unable to advance diet at SLP follow-up, recommend considering placement of Cortrak feeding tube and initiation of enteral nutrition  - Recommend bowel regimen as last recorded bowel movement is from 12/23  NUTRITION DIAGNOSIS:   Inadequate oral intake related to inability to eat as evidenced by NPO status.  Ongoing  GOAL:   Patient will meet greater than or equal to 90% of their needs  Unmet  MONITOR:   Diet advancement, Labs, I & O's, Weight trends  REASON FOR ASSESSMENT:   Ventilator, Consult Enteral/tube feeding initiation and management (verbal consult)  ASSESSMENT:   22 yo male with PMH of autism who was admitted with SIRS/sepsis syndrome and focal seizures. Required intubation on admission.  12/24 - TF started 12/26 - extubated  Noted SLP recommends NPO after bedside swallow evaluation. Per SLP, "hopeful for a quick turnaround given young age and no h/o dysphagia, but he is not yet ready for POs today." If unable to advance diet after reassessment, recommend considering placement of Cortrak feeding tube. Will continue to follow for diet advancement.  Discussed pt with RN.  Unable to see pt at time of visit as sterile procedure (PICC placement) ongoing.  Weight fairly stable since admission with fluctuations between 187-203 lbs.  Medications reviewed and include: IV Keppra  Labs reviewed. CBG's: 91, 97, 93, 109, 92, 108 x 24 hours  UOP: 4300 ml x 24 hours I/O's: +5.8 L since admit  Diet Order:   Diet Order            Diet NPO time specified  Diet effective now              EDUCATION NEEDS:   No education needs have been identified at this time  Skin:  Skin Assessment: Reviewed RN Assessment  Last BM:  12/23  Height:   Ht Readings from Last 1 Encounters:  12/24/17 5\' 7"  (1.702 m)    Weight:   Wt Readings from Last 1  Encounters:  12/28/17 85 kg    Ideal Body Weight:  67.3 kg  BMI:  Body mass index is 29.35 kg/m.  Estimated Nutritional Needs:   Kcal:  2300-2500  Protein:  120-140 gm  Fluid:  2.3 L    Earma ReadingKate Jablonski Bailei Buist, MS, RD, LDN Inpatient Clinical Dietitian Pager: [phone] Weekend/After Hours: [phone]

## 2017-12-28 NOTE — Progress Notes (Signed)
NAME:  Lorayne BenderDavid Bradt, MRN:  [id number], DOB:  01/05/1995, LOS: 4 ADMISSION DATE:  12/24/2017, CONSULTATION DATE:  12/23 REFERRING MD:  isacc, CHIEF COMPLAINT:  Seizure and possible sepsis   Brief History   22 year old male patient with history of autism, admitted 12/23 for status epilepticus, and possible sepsis with concern for CNS infection Intubated for airway protection, underwent lumbar puncture with no evidence of CNS infection.  Past Medical History  Autism, seizures. At baseline he is minimally verbal, he is able to speak in 1-4 word phrases, at approximately "21-year-old level" per his mother he is ambulatory, able to eat on his own.  He has significant behavioral problems, typically becomes combative when agitated.   Significant Hospital Events   12/23 admitted with working diagnosis of possible sepsis, concern for CNS infection, IV vancomycin and ceftriaxone started 12/25 Antibiotics stopped 12/26 Extubated  Consults:  Neuro 12/23  Procedures:  OETT 12/23 LP 12/23.  WBC 3, protein 19, glucose 87  Significant Diagnostic Tests:  CT brain 12/23: neg MRI head 12/24-no acute abnormality.  X-ray 12/27/2017-lungs are clear.  I have reviewed the images personally.  Micro Data:  BCX2 12/23>>>neg UC 12/24>>> neg CSF 12/23>>> neg Flu PCR 12/23 >> neg Respiratory virus panel 12/23 >> neg MRSA PCR 12/23 > neg Urine pneumococcus, urine Legionella 12/24 > neg HIV 12/24 > neg Pct 12/25 < 0.10  Antimicrobials:  Vanc 12/23>> 12/25 Rocephin 12/23>> 12/25  Interim history/subjective:  Complains of some chest discomfort More awake. Has some tremors of his arms and legs.  Mother is concerned about this  Objective   Blood pressure 106/63, pulse (!) 114, temperature 99.1 F (37.3 C), temperature source Oral, resp. rate (!) 23, height 5\' 7"  (1.702 m), weight 85 kg, SpO2 91 %.    Vent Mode: PSV;CPAP FiO2 (%):  [40 %] 40 % Set Rate:  [16 bmp] 16 bmp Vt Set:  [520 mL] 520  mL PEEP:  [5 cmH20] 5 cmH20 Pressure Support:  [10 cmH20] 10 cmH20 Plateau Pressure:  [15 cmH20-18 cmH20] 15 cmH20   Intake/Output Summary (Last 24 hours) at 12/28/2017 0828 Last data filed at 12/28/2017 0800 Gross per 24 hour  Intake 1325.3 ml  Output 4375 ml  Net -3049.7 ml   Filed Weights   12/26/17 0300 12/27/17 0500 12/28/17 0403  Weight: 92.1 kg 90.2 kg 85 kg   Examination:. Gen:      No acute distress HEENT:  EOMI, sclera anicteric Neck:     No masses; no thyromegaly Lungs:    Clear to auscultation bilaterally; normal respiratory effort CV:         Regular rate and rhythm; no murmurs Abd:      + bowel sounds; soft, non-tender; no palpable masses, no distension Ext:    No edema; adequate peripheral perfusion Skin:      Warm and dry; no rash Neuro: alert and oriented x 3 Psych: Awake.responds to simple commands  Resolved Hospital Problem list     Assessment & Plan:  Status epilepticus Acute metabolic encephalopathy, superimposed on underlying autism with behavioral problems Plan AEDs per neurology.  Long-term EEG discontinued. Serial neuro exam MRI is normal  Drug-related hypotension -CT brain negative Plan Off antibiotics as there is no evidence of infection. All cultures and micro workup is negative to date.  Respiratory failure w/ need for airway protection s/p seizure  Plan Stable post extubation On Room air  Poor IV acces We would like to avoid PICC or central line Check  check speech therapy and see if we can be cleared and transition meds to PO  Best practice:  Diet: NPO Pain/Anxiety/Delirium protocol (if indicated): Off VAP protocol (if indicated): NA DVT prophylaxis: Lovenox GI prophylaxis: NA Glucose control: na Mobility: BR Code Status: Full Family Communication: Mother updated at bedside daily Disposition: Keep in ICU for 24 more hrs  Chilton GreathousePraveen Mauri Temkin MD [facility] Pager [phone] If no answer or after 3pm  call: [phone] 12/28/2017, 8:28 AM

## 2017-12-28 NOTE — Consult Note (Addendum)
[facility] for Infectious Disease    Date of Admission:  12/24/2017     Total days of antibiotics 3 total, last 12/25 (vanco + ceftriaxone)                Reason for Consult: Fever, Seizure     Referring Provider: Dr. Vaughan Browner  Primary Care Provider: System, Pcp Not In   Assessment: 22 y.o. male with autism admitted with new onset seizure activity and fever. Lack of white blood cells in CSF argue against CNS infectious process. No micro data are revealing; no signs of bacterial contributor presently. He interestingly had what sounds to have been a prodrome of possible viral illness however even with aseptic meningoencephalitis we would still expect to see elevated wbcs on CSF. He has been started on dual anti-epileptic agents (dilantin + keppra); could consider drug fever? He has more severe tremors now - ?neuroleptic malignant syndrome vs EPS. Discussed with neurology. Dilantin to be stopped and continue on keppra alone. Follow fever curve and any new symptoms.   Family lives in [place] and wondering about transfer to [place] father works there.   Plan: 1. Continue to observe off antibiotics. 2. Follow micro data and temperature trend 3. Headache post LP - would blood patch be helpful?    Active Problems:   Seizure (HCC)   Fever   Headache   . chlorhexidine gluconate (MEDLINE KIT)  15 mL Mouth Rinse BID  . clonazepam  0.5 mg Oral BID  . enoxaparin (LOVENOX) injection  40 mg Subcutaneous Q24H  . mouth rinse  15 mL Mouth Rinse BID  . mirtazapine  15 mg Oral QHS  . OXcarbazepine  300 mg Oral BID  . risperiDONE  3 mg Oral BID  . sodium chloride flush  10-40 mL Intracatheter Q12H    HPI: Clifford Mosley is a 22 y.o. male with past medical history significant for autism and seizures (as a child on Trileptal, no medications for several years).   He had been living at home with his mother until a week ago when he transitioned to a group home. Care  providers noted that he became more withdrawn around the 20th and decreased PO intake. Mild tremors noted on 12/21 - EMS called and evaluation at the time per chart review was benign and no further recommendation for evaluation warranted. On the 23rd he was found to be more lethargic and diaphoretic - EMS responded to find he was obtunded with some seizure like activity of upper extremeties; fever noted 101 F. He was taken to the hospital for admission on 12/23 with status epilepticus that was concerning for primary CNS infection. At presentation he was intubated for airway protection, febrile at 101.1 F, no leukocytosis and hypernatremia 150. He underwent lumbar puncture which was normal (detailed below). Received antibiotic coverage for 3 days with ceftriaxone vancomycin through 12/25. On 12/27 developed fever to 101.7 F blood cultures were redrawn and pending. He has been started on dilantin and keppra and being followed by [facility] and neurology.   Infectious work up thus far has revealed negative blood cultures, negative resp viral panel, negative HIV, negative strep pneumoniae urine antigen, CSF profile with normal wbc (3, 1) normal protein  glucose 87, negative culture growth. CT and MRI brain negative. Chest xray today with minimal prominent markings psb related to atelectasis but pneumonia cannot be excluded.   His mother and father are at the bedside - mom  reports that the tremors he is experiencing are new - he has a history of fine tremor of the hand with eating foods/drinking but never has he had activity in his lower extremity before. His RUE shaking is now severe. He also complains of headache across front of forehead and is in significant discomfort.   Review of Systems  Unable to perform ROS: Mental acuity    Past Medical History:  Diagnosis Date  . Autism     Social History   Tobacco Use  . Smoking status: Never Smoker  . Smokeless tobacco: Never Used  Substance Use Topics  .  Alcohol use: Never    Frequency: Never  . Drug use: Never    History reviewed. No pertinent family history. No Known Allergies  OBJECTIVE: Blood pressure 137/84, pulse (!) 107, temperature 98.2 F (36.8 C), temperature source Oral, resp. rate 17, height _0  (1.702 m), weight 85 kg, SpO2 93 %.  Physical Exam Vitals signs and nursing note reviewed.  Constitutional:      General: Distressed: In pain, grimacing.     Appearance: He is ill-appearing.  HENT:     Mouth/Throat:     Mouth: Mucous membranes are dry.     Pharynx: Oropharynx is clear.  Eyes:     General: No scleral icterus. Neck:     Musculoskeletal: No neck rigidity or muscular tenderness.  Cardiovascular:     Rate and Rhythm: Regular rhythm. Tachycardia present.     Pulses: Normal pulses.     Heart sounds: Normal heart sounds. No murmur.  Pulmonary:     Effort: Pulmonary effort is normal.     Breath sounds: Normal breath sounds.  Abdominal:     General: Bowel sounds are normal. There is no distension.  Musculoskeletal:        General: Swelling: generalized swelling.  Lymphadenopathy:     Cervical: No cervical adenopathy.  Skin:    General: Skin is warm and dry.     Capillary Refill: Capillary refill takes less than 2 seconds.     Findings: No erythema, lesion or rash.  Neurological:     Mental Status: He is alert.     Comments: Coarse right upper extremity shaking/tremors. Right lower extremity shaking.      Lab Results Lab Results  Component Value Date   WBC 8.4 12/28/2017   HGB 13.6 12/28/2017   HCT 40.9 12/28/2017   MCV 85.2 12/28/2017   PLT 253 12/28/2017    Lab Results  Component Value Date   CREATININE 0.56 (L) 12/28/2017   BUN 7 12/28/2017   NA 137 12/28/2017   K 3.5 12/28/2017   CL 101 12/28/2017   CO2 24 12/28/2017    Lab Results  Component Value Date   ALT 140 (H) 12/28/2017   AST 135 (H) 12/28/2017   ALKPHOS 114 12/28/2017   BILITOT 1.3 (H) 12/28/2017      Microbiology: Recent Results (from the past 240 hour(s))  Blood Culture (routine x 2)     Status: None (Preliminary result)   Collection Time: 12/24/17 12:30 PM  Result Value Ref Range Status   Specimen Description BLOOD RIGHT ANTECUBITAL  Final   Special Requests   Final    BOTTLES DRAWN AEROBIC AND ANAEROBIC Blood Culture adequate volume   Culture   Final    NO GROWTH 4 DAYS Performed at [facility], [place] [address] [place],  [address]    Report Status PENDING  Incomplete  Blood Culture (  routine x 2)     Status: None (Preliminary result)   Collection Time: 12/24/17 12:32 PM  Result Value Ref Range Status   Specimen Description BLOOD LEFT ANTECUBITAL  Final   Special Requests   Final    BOTTLES DRAWN AEROBIC AND ANAEROBIC Blood Culture adequate volume   Culture   Final    NO GROWTH 4 DAYS Performed at [facility], [address], [place], [place] [address]    Report Status PENDING  Incomplete  Respiratory Panel by PCR     Status: None   Collection Time: 12/24/17  2:58 PM  Result Value Ref Range Status   Adenovirus NOT DETECTED NOT DETECTED Final   Coronavirus 229E NOT DETECTED NOT DETECTED Final   Coronavirus HKU1 NOT DETECTED NOT DETECTED Final   Coronavirus NL63 NOT DETECTED NOT DETECTED Final   Coronavirus OC43 NOT DETECTED NOT DETECTED Final   Metapneumovirus NOT DETECTED NOT DETECTED Final   Rhinovirus / Enterovirus NOT DETECTED NOT DETECTED Final   Influenza A NOT DETECTED NOT DETECTED Final   Influenza B NOT DETECTED NOT DETECTED Final   Parainfluenza Virus 1 NOT DETECTED NOT DETECTED Final   Parainfluenza Virus 2 NOT DETECTED NOT DETECTED Final   Parainfluenza Virus 3 NOT DETECTED NOT DETECTED Final   Parainfluenza Virus 4 NOT DETECTED NOT DETECTED Final   Respiratory Syncytial Virus NOT DETECTED NOT DETECTED Final   Bordetella pertussis NOT DETECTED NOT DETECTED Final   Chlamydophila pneumoniae NOT DETECTED NOT DETECTED Final   Mycoplasma  pneumoniae NOT DETECTED NOT DETECTED Final    Comment: Performed at [facility], [place] [address] [place], [place] [address]  CSF culture     Status: None   Collection Time: 12/24/17  4:07 PM  Result Value Ref Range Status   Specimen Description CSF  Final   Special Requests Normal  Final   Gram Stain   Final    WBC PRESENT, PREDOMINANTLY MONONUCLEAR NO ORGANISMS SEEN CYTOSPIN SMEAR    Culture   Final    NO GROWTH 3 DAYS Performed at [facility], [place] [address] [place], [place] [address]    Report Status 12/27/2017 FINAL  Final  MRSA PCR Screening     Status: None   Collection Time: 12/24/17  6:29 PM  Result Value Ref Range Status   MRSA by PCR NEGATIVE NEGATIVE Final    Comment:        The GeneXpert MRSA Assay (FDA approved for NASAL specimens only), is one component of a comprehensive MRSA colonization surveillance program. It is not intended to diagnose MRSA infection nor to guide or monitor treatment for MRSA infections. Performed at [facility], [place] [address] [place], [place] [address]   Urine culture     Status: None   Collection Time: 12/25/17  3:38 AM  Result Value Ref Range Status   Specimen Description URINE, CATHETERIZED  Final   Special Requests Normal  Final   Culture   Final    NO GROWTH Performed at [facility], [address] [place],  [address]    Report Status 12/26/2017 FINAL  Final    Janene Madeira, MSN, NP-C [facility] Cell: [phone] Pager: [phone]  12/28/2017 5:42 PM

## 2017-12-28 NOTE — Progress Notes (Signed)
Peripherally Inserted Central Catheter/Midline Placement  The IV Nurse has discussed with the patient and/or persons authorized to consent for the patient, the purpose of this procedure and the potential benefits and risks involved with this procedure.  The benefits include less needle sticks, lab draws from the catheter, and the patient may be discharged home with the catheter. Risks include, but not limited to, infection, bleeding, blood clot (thrombus formation), and puncture of an artery; nerve damage and irregular heartbeat and possibility to perform a PICC exchange if needed/ordered by physician.  Alternatives to this procedure were also discussed.  Bard Power PICC patient education guide, fact sheet on infection prevention and patient information card has been provided to patient /or left at bedside.    PICC/Midline Placement Documentation     Consent obtained with mother at bedside   Timmothy Soursewman, Rishith Siddoway Renee 12/28/2017, 12:49 PM

## 2017-12-29 DIAGNOSIS — R509 Fever, unspecified: Secondary | ICD-10-CM

## 2017-12-29 DIAGNOSIS — G252 Other specified forms of tremor: Secondary | ICD-10-CM

## 2017-12-29 DIAGNOSIS — A419 Sepsis, unspecified organism: Secondary | ICD-10-CM

## 2017-12-29 LAB — GLUCOSE, CAPILLARY
GLUCOSE-CAPILLARY: 94 mg/dL (ref 70–99)
Glucose-Capillary: 84 mg/dL (ref 70–99)
Glucose-Capillary: 86 mg/dL (ref 70–99)

## 2017-12-29 LAB — CULTURE, BLOOD (ROUTINE X 2)
CULTURE: NO GROWTH
Culture: NO GROWTH
Special Requests: ADEQUATE
Special Requests: ADEQUATE

## 2017-12-29 MED ORDER — RESOURCE THICKENUP CLEAR PO POWD
ORAL | Status: DC | PRN
  Filled 2017-12-29: qty 125

## 2017-12-29 NOTE — Progress Notes (Signed)
  Speech Language Pathology Treatment: Dysphagia  Patient Details Name: Clifford BenderDavid Mosley MRN: [id number] DOB: 1995-07-06 Today's Date: 12/29/2017 Time: [id number] SLP Time Calculation (min) (ACUTE ONLY): 13 min  Assessment / Plan / Recommendation Clinical Impression  Pt demonstrates improved arousal, but ongoing hoarse vocal quality and immediate cough following sips of water. Pt was able to tolerate 4 oz of puree with total assist feeding from his mother without immediate signs of aspiration or wet vocal quality. Also trials 3 oz of nectar thick liquids via cup and straw with similar result. Suspect ongoing compromised swallow integrity following extubation, however symptoms absent with diet modification. Given strong immediate cough response with even small sips of water anticipate tolerance of temporary diet modification with close supervision from mother. Will f/u for diet upgrade as pt recovers.     HPI HPI: Pt is a 22 year old male patient admitted with breakthrough seizure, presenting as status epilepticus and requiring ETT 12/23-12/26. MRI and LP negative. PMH: autism with behavioral problems, seizures      SLP Plan  Continue with current plan of care       Recommendations  Diet recommendations: Dysphagia 3 (mechanical soft);Nectar-thick liquid Liquids provided via: Cup;Straw Medication Administration: Whole meds with puree Compensations: Slow rate;Small sips/bites                Oral Care Recommendations: Oral care BID Follow up Recommendations: 24 hour supervision/assistance Plan: Continue with current plan of care       GO               Clifford DittyBonnie Taci Sterling, MA CCC-SLP  Acute Rehabilitation Services Pager [phone] Office [phone]  Clifford Mosley, Clifford Mosley 12/29/2017, 8:56 AM

## 2017-12-29 NOTE — Progress Notes (Addendum)
Subjective: Some benefit from clonopin, but tremor still prominent.   Exam: Vitals:   12/29/17 1100 12/29/17 1119  BP: 135/83   Pulse:    Resp: (!) 21   Temp:  98.3 F (36.8 C)  SpO2:     Gen: In bed, NAD Resp: non-laboured breathing.  Abd: soft, nt  Sedated Neuro: MS: Eyes open, able to tell me it is his mom with him, follows commands and answers simple questions.  UV:OZDGUCN:PERRL, face symmetric Motor: coarse tremor present bilaterally Sensory:as above.   Pertinent Labs: CSF 12/23  WBC 1 RBC 1 Protein 19 Glucose 87  Mg 12/26 1.8 Na 142 Cr 0.49  No leukocytosis  Impression: 22 yo M with autisim and seizure disorder who presented with breakthrough seizure presetnign as status epilepticus. 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 This has had some benefit from clonazepam, but   Recommendations: 1) Hold adderall 2) continue clonopin 0.5mg  BID 3) continue trileptal 300mg  BID 4) will follow.   Ritta SlotMcNeill Rosmary Dionisio, MD [facility] [phone]  If 7pm- 7am, please page neurology on call as listed in AMION.

## 2017-12-29 NOTE — Progress Notes (Signed)
PT Cancellation Note  Patient Details Name: Clifford BenderDavid Brereton MRN: [id number] DOB: 1995-09-07   Cancelled Treatment:    Reason Eval/Treat Not Completed: Active bedrest order. Pt currently with strict bed rest orders in place. PT will continue to follow acutely and await updated activity orders.    Alessandra BevelsJennifer M Deja Pisarski 12/29/2017, 11:33 AM

## 2017-12-29 NOTE — Progress Notes (Signed)
   NAME:  Clifford Mosley, MRN:  [id number], DOB:  05-24-1995, LOS: 5 ADMISSION DATE:  12/24/2017, CONSULTATION DATE:  12/23 REFERRING MD:  isacc, CHIEF COMPLAINT:  Seizure and possible sepsis   Brief History   22 year old male patient with history of autism, admitted 12/23 for status epilepticus, and possible sepsis with concern for CNS infection Intubated for airway protection, underwent lumbar puncture with no evidence of CNS infection.  Past Medical History  Autism, seizures. At baseline he is minimally verbal, he is able to speak in 1-4 word phrases, at approximately "21-year-old level" per his mother he is ambulatory, able to eat on his own.  He has significant behavioral problems, typically becomes combative when agitated.   Significant Hospital Events   12/23 admitted with working diagnosis of possible sepsis, concern for CNS infection, IV vancomycin and ceftriaxone started 12/25 Antibiotics stopped 12/26 Extubated  Consults:  Neuro 12/23  Procedures:  OETT 12/23>>> LP 12/23>>WBC 3, protein 19, glucose 87  Significant Diagnostic Tests:  CT brain 12/23: neg MRI head 12/24-no acute abnormality.  X-ray 12/27/2017-lungs are clear.  I have reviewed the images personally.  Micro Data:  BCX2 12/23>>>neg UC 12/24>>> neg CSF 12/23>>> neg Flu PCR 12/23 >> neg Respiratory virus panel 12/23 >> neg MRSA PCR 12/23 > neg Urine pneumococcus, urine Legionella 12/24 > neg HIV 12/24 > neg Pct 12/25 < 0.10  Antimicrobials:  Vanc 12/23>> 12/25 Rocephin 12/23>> 12/25  Interim history/subjective:  No events overnight No new complaints  Objective   Blood pressure (!) 143/110, pulse 93, temperature 99.2 F (37.3 C), temperature source Axillary, resp. rate 19, height 5\' 7"  (1.702 m), weight 84.5 kg, SpO2 91 %.        Intake/Output Summary (Last 24 hours) at 12/29/2017 0855 Last data filed at 12/29/2017 0800 Gross per 24 hour  Intake 1990.85 ml  Output 1000 ml  Net 990.85 ml    Filed Weights   12/27/17 0500 12/28/17 0403 12/29/17 0434  Weight: 90.2 kg 85 kg 84.5 kg   Examination:. Gen:      Well appearing, NAD HEENT:  [place]/AT, PERRL, EOM-I and MMM Neck:      No masses; no thyromegaly Lungs:    CTA bilaterally CV:         RRR, Nl S1/S2 and -M/R/G Abd:      Soft, NT, ND and +BS Ext:    -edema and -tenderness Skin:       Intact Neuro:    Alert and oriented x3  I reviewed CXR myself, no acute disease noted  Resolved Hospital Problem list     Assessment & Plan:  Status epilepticus Acute metabolic encephalopathy, superimposed on underlying autism with behavioral problems Plan AEDs per neurology.  Long-term EEG discontinued. Serial neuro exam MRI is normal Ambulate  Drug-related hypotension -CT brain negative Plan D/C abx Monitor clinically  Respiratory failure w/ need for airway protection s/p seizure  Plan Titrate O2 for sat of 88-92%  Poor IV acces PIV KVO IVF Encourage diet  [facility] off 12/29  Discussed with TRH-MD  Best practice:  Diet: NPO Pain/Anxiety/Delirium protocol (if indicated): Off VAP protocol (if indicated): NA DVT prophylaxis: Lovenox GI prophylaxis: NA Glucose control: na Mobility: BR Code Status: Full Family Communication: Mother updated at bedside daily Disposition: Keep in ICU for 24 more hrs  Clifford Mosley, M.D. [facility]. Pager: [phone]. After hours pager: [phone].

## 2017-12-29 NOTE — Progress Notes (Addendum)
Subjective: Extuabted  Exam: Vitals:   12/29/17 1100 12/29/17 1119  BP: 135/83   Pulse:    Resp: (!) 21   Temp:  98.3 F (36.8 C)  SpO2:     Gen: In bed, intubated Resp: ventilated Abd: soft, nt  Sedated Neuro: MS: Eyes opne , followsd commands.  ZO:XWRUECN:PERRL, face symmetric Motor: coarse tremor present bilaterally Sensory:as above.   Pertinent Labs: CSF 12/23  WBC 1 RBC 1 Protein 19 Glucose 87  Mg 12/26 1.8 Na 142 Cr 0.49  No leukocytosis  Impression: 22 yo M with autisim and seizure disorder who presented with breakthrough seizure presetnign as status epilepticus. 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 This may respond some to benzodiazepines.   Keppra may contribute to aggression, and he tolerated trileptal well in the past, therefore I would favor changing to trileptal monotherapy.   Recommendations: 1) Hold adderall 2) start clonazepam  3) stop dilantin, keppra 4) will follow.   Ritta SlotMcNeill Eulene Pekar, MD [facility] [phone]  If 7pm- 7am, please page neurology on call as listed in AMION.

## 2017-12-30 ENCOUNTER — Inpatient Hospital Stay (HOSPITAL_COMMUNITY): Payer: Medicaid Other

## 2017-12-30 DIAGNOSIS — G259 Extrapyramidal and movement disorder, unspecified: Secondary | ICD-10-CM

## 2017-12-30 DIAGNOSIS — R4182 Altered mental status, unspecified: Secondary | ICD-10-CM

## 2017-12-30 LAB — CBC
HCT: 36.4 % — ABNORMAL LOW (ref 39.0–52.0)
Hemoglobin: 12 g/dL — ABNORMAL LOW (ref 13.0–17.0)
MCH: 28 pg (ref 26.0–34.0)
MCHC: 33 g/dL (ref 30.0–36.0)
MCV: 85 fL (ref 80.0–100.0)
Platelets: 228 10*3/uL (ref 150–400)
RBC: 4.28 MIL/uL (ref 4.22–5.81)
RDW: 12.4 % (ref 11.5–15.5)
WBC: 8.5 10*3/uL (ref 4.0–10.5)
nRBC: 0 % (ref 0.0–0.2)

## 2017-12-30 LAB — BASIC METABOLIC PANEL
Anion gap: 9 (ref 5–15)
BUN: <5 mg/dL — ABNORMAL LOW (ref 6–20)
CHLORIDE: 97 mmol/L — AB (ref 98–111)
CO2: 28 mmol/L (ref 22–32)
Calcium: 8.8 mg/dL — ABNORMAL LOW (ref 8.9–10.3)
Creatinine, Ser: 0.53 mg/dL — ABNORMAL LOW (ref 0.61–1.24)
GFR calc Af Amer: >60 mL/min (ref >60)
GFR calc non Af Amer: >60 mL/min (ref >60)
Glucose, Bld: 119 mg/dL — ABNORMAL HIGH (ref 70–99)
Potassium: 3.2 mmol/L — ABNORMAL LOW (ref 3.5–5.1)
Sodium: 134 mmol/L — ABNORMAL LOW (ref 135–145)

## 2017-12-30 LAB — LIPASE, BLOOD: Lipase: 34 U/L (ref 11–51)

## 2017-12-30 LAB — MAGNESIUM: Magnesium: 1.8 mg/dL (ref 1.7–2.4)

## 2017-12-30 LAB — AMYLASE: Amylase: 43 U/L (ref 28–100)

## 2017-12-30 LAB — PHOSPHORUS: Phosphorus: 3.4 mg/dL (ref 2.5–4.6)

## 2017-12-30 MED ORDER — POTASSIUM CHLORIDE 10 MEQ/50ML IV SOLN
10.0000 meq | INTRAVENOUS | Status: DC
  Filled 2017-12-30 (×4): qty 50

## 2017-12-30 MED ORDER — POTASSIUM CHLORIDE 20 MEQ PO PACK
40.0000 meq | PACK | Freq: Once | ORAL | Status: AC
  Administered 2017-12-30: 40 meq via ORAL
  Filled 2017-12-30: qty 2

## 2017-12-30 MED ORDER — ACETAMINOPHEN 325 MG PO TABS
650.0000 mg | ORAL_TABLET | Freq: Four times a day (QID) | ORAL | Status: DC | PRN
  Administered 2017-12-30 – 2017-12-31 (×4): 650 mg via ORAL
  Filled 2017-12-30 (×3): qty 2

## 2017-12-30 MED ORDER — POTASSIUM CHLORIDE CRYS ER 20 MEQ PO TBCR
40.0000 meq | EXTENDED_RELEASE_TABLET | Freq: Once | ORAL | Status: DC
  Filled 2017-12-30: qty 2

## 2017-12-30 MED ORDER — RISPERIDONE 2 MG PO TBDP
2.0000 mg | ORAL_TABLET | Freq: Two times a day (BID) | ORAL | Status: DC
  Administered 2017-12-30 – 2017-12-31 (×2): 2 mg via ORAL
  Filled 2017-12-30 (×3): qty 1

## 2017-12-30 NOTE — Progress Notes (Addendum)
eLink Physician-Brief Progress Note Patient Name: Lorayne BenderDavid Beckner DOB: May 14, 1995 MRN: [id number]   Date of Service  12/30/2017  HPI/Events of Note  Patient c/o abdominal pain. Nurse states that abdomen is tender to her exam. CBC is pending from AM labs. Nurse giving ordered Fentanyl for pain.   eICU Interventions  Will order: 1. Abdominal KUB STAT. 2. Amylase and Lipase now.  3. Will ask ground team to evaluate patient at bedside.      Intervention Category Intermediate Interventions: Pain - evaluation and management  Mikenna Bunkley Eugene 12/30/2017, 5:31 AM

## 2017-12-30 NOTE — Progress Notes (Signed)
Overnight Critical care note  Event:  Called for evaluation of abdominal pain  A/p;  Patient given fentanyl prior to evaluation.  Currently patient is soft, nt,nd, bs+.  Per patient mother several episodes of diarrhea.    - pending axr, lipase, amylase  - will follow up on results

## 2017-12-30 NOTE — Plan of Care (Signed)
  Problem: Elimination: Goal: Will not experience complications related to bowel motility Outcome: Progressing   Problem: Pain Managment: Goal: General experience of comfort will improve Outcome: Progressing   Problem: Skin Integrity: Goal: Risk for impaired skin integrity will decrease Outcome: Progressing   Problem: Respiratory: Goal: Ability to maintain adequate ventilation will improve Outcome: Progressing

## 2017-12-30 NOTE — Progress Notes (Addendum)
PROGRESS NOTE    Clifford Mosley   DVV:[id number]  DOB: [birth year]  DOA: 12/24/2017 PCP: System, Pcp Not In   Brief Narrative:  Clifford Mosley is a 22 year old male patient with history of autism and seizure disorder admitted 12/23 for status epilepticus, and possible sepsis with concern for CNS infection. At baseline he is minimally verbal, he is able to speak in 1-4 word phrases, at approximately "70-year-old level" per his mother he is ambulatory, able to eat on his own.  He has been living with his mother and step father in [place] until about 1 wk ago when he was transitioned to a group home in [place] as he was becoming increasingly agitated at home and difficult to take care of. He has significant behavioral problems, typically becomes combative when agitated. His mother states that he was declining his Risperdal at the group home and he was not given it. She also thinks he was receiving Haldol as needed at the home.  Per PCCM note>  On the 21st care provider noted some mild tremors, EMS was called, they could find no other abnormalities, he apparently had some history of tremor in the past so they felt no further evaluation was warranted.  On the 23rd his care provider evaluated him and found him to be more lethargic, more withdrawn, nonverbal, and diaphoretic EMS was summoned.   On arrival his temperature was greater than 101, he was obtunded, and shortly after demonstrated bilateral upper and lower extremity rhythmic seizure-like activity.  Intubated for airway protection and admitted to ICU, underwent lumbar puncture with no evidence of CNS infection. Antibiotics discontinued on 12/25.  Extubated on 12/26   Subjective: He is non verbal. Per his mother: still having tremor, not sleeping much. Mother thinks he is in pain because he is grimacing. He has had 5 BMs since last night and has been touching his R abdomen to show where pain is. Mother states he gags when food touchs his throat.  He is taking pureed food.      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 - given Keppra, Dilantin and then transitioned to Trileptal on 12/27- neuro recommends to continue Clonopin and hold Adderal  Active Problems:   Fever - no source found- has not recurred after antibiotics stopped - CXR unrevealing- blood cultures negative, CXF culture negative, Resp panel negative, Urine culture negative  Diarrhea - just started last night abdomen soft and benign on exam- Xray unrevealing- has only been on liquids and has not had any solid food- no laxatives given - will monitor without intervention for now  "gagging on food" - have asked SLP to re-eval- he is on D3 with Nectar thick liquids but only being fed liquids and applesauce by mother - cont IVF for now    Coarse tremors - 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 I have contacted his practice as he is not on call. I have been told by the on call physician that he is an outpt PCP and does not have hospital privileges.   I have asked for a medication list (MAR) from his group home.  DVT prophylaxis: Lovenox Code Status: Full code Family Communication: mother and step father at bedside Disposition Plan: undetermined Consultants:   Admitted to PCCM  Neuro  Psych Procedures:   Intubation/ Extubation  EEG  LP Antimicrobials:  Anti-infectives (From admission, onward)   Start     Dose/Rate Route Frequency Ordered Stop   12/26/17 1000  cefTRIAXone (ROCEPHIN) 2 g in sodium chloride 0.9 % 100 mL IVPB  Status:  Discontinued     2 g 200 mL/hr over 30 Minutes Intravenous Every 24 hours 12/25/17 1118  12/25/17 1119   12/26/17 1000  cefTRIAXone (ROCEPHIN) 2 g in sodium chloride 0.9 % 100 mL IVPB  Status:  Discontinued     2 g 200 mL/hr over 30 Minutes Intravenous Every 24 hours 12/25/17 1119 12/26/17 1157   12/25/17 2000  vancomycin (VANCOCIN) IVPB 1000 mg/200 mL premix  Status:  Discontinued     1,000 mg 200 mL/hr over 60 Minutes Intravenous Every 12 hours 12/25/17 1118 12/26/17 1157   12/25/17 0800  vancomycin (VANCOCIN) IVPB 1000 mg/200 mL premix  Status:  Discontinued     1,000 mg 200 mL/hr over 60 Minutes Intravenous Every 8 hours 12/25/17 0734 12/25/17 1032   12/25/17 0100  ceFEPIme (MAXIPIME) 2 g in sodium chloride 0.9 % 100 mL IVPB  Status:  Discontinued     2 g 200 mL/hr over 30 Minutes Intravenous Every 12 hours 12/24/17 1353 12/24/17 1526   12/25/17 0100  cefTRIAXone (ROCEPHIN) 2 g in sodium chloride 0.9 % 100 mL IVPB  Status:  Discontinued     2 g 200 mL/hr over 30 Minutes Intravenous Every 12 hours 12/24/17 1531 12/25/17 1032   12/24/17 2200  vancomycin (VANCOCIN) IVPB 1000 mg/200 mL premix  Status:  Discontinued     1,000 mg 200 mL/hr over 60 Minutes Intravenous Every 12 hours 12/24/17 1353 12/25/17 0734   12/24/17 1600  ampicillin (OMNIPEN) 2 g in sodium chloride 0.9 % 100 mL IVPB  Status:  Discontinued     2 g 300 mL/hr over 20 Minutes Intravenous Every 4 hours 12/24/17 1526 12/24/17 1533   12/24/17 1230  ceFEPIme (MAXIPIME) 2 g in sodium chloride 0.9 % 100 mL IVPB     2 g 200 mL/hr over 30 Minutes Intravenous  Once 12/24/17 1222 12/24/17 1336   12/24/17 1230  metroNIDAZOLE (FLAGYL) IVPB 500 mg  Status:  Discontinued     500 mg 100 mL/hr over 60 Minutes Intravenous Every 8 hours 12/24/17 1222 12/24/17 1526   12/24/17 1230  vancomycin (VANCOCIN) IVPB 1000 mg/200 mL premix     1,000 mg 200 mL/hr over 60 Minutes Intravenous  Once 12/24/17 1222 12/24/17 1451       Objective: Vitals:   12/29/17 2003 12/29/17 2239 12/30/17 0453 12/30/17 0624  BP: 126/87 (!) 153/97  137/83 (!) 146/73  Pulse:  (!) 102 (!) 113 100  Resp: (!) 21 (!) 21    Temp:  98.1 F (36.7 C) 98.7 F (37.1 C)   TempSrc:  Oral Oral   SpO2: 94% 97% 96%   Weight:  84.4 kg    Height:        Intake/Output Summary (Last 24 hours) at 12/30/2017 0912 Last data filed at 12/30/2017 0600 Gross per 24 hour  Intake 2139.21 ml  Output 525 ml  Net 1614.21 ml   Filed Weights   12/28/17 0403 12/29/17 0434 12/29/17 2239  Weight: 85 kg 84.5 kg 84.4 kg    Examination: General exam:  Nonverbal-  On my eval he is calm, has a moderate termor, being changed by RNs, points to left arm where PICC is when asked about pain HEENT: PERRL, oral mucosa moist, no sclera icterus or thrush Respiratory system: Clear to auscultation. Respiratory effort normal. Cardiovascular system: S1 & S2 heard, RRR.   Gastrointestinal system: Abdomen soft, non-tender, nondistended. Normal bowel sounds. Central nervous system: Alert - arms trembling. Extremities: No cyanosis, clubbing or edema Skin: No rashes or ulcers Psychiatry:  Mood & affect cannot be assessed    Data Reviewed: I have personally reviewed following labs and imaging studies  CBC: Recent Labs  Lab 12/24/17 1245  12/25/17 0349 12/26/17 1119 12/27/17 0420 12/28/17 0236 12/30/17 0554  WBC 7.3   < > 8.8 5.8 5.0 8.4 8.5  NEUTROABS 4.9  --   --   --   --   --   --   HGB 14.2   < > 12.2* 10.6* 11.4* 13.6 12.0*  HCT 45.3   < > 38.3* 32.7* 35.1* 40.9 36.4*  MCV 88.5   < > 88.0 85.8 87.1 85.2 85.0  PLT 269   < > 259 181 148* 253 228   < > = values in this interval not displayed.   Basic Metabolic Panel: Recent Labs  Lab 12/25/17 0349 12/26/17 0407 12/26/17 1119 12/27/17 0420 12/28/17 0236 12/30/17 0554  NA 149*  --  144 142 137 134*  K 3.4*  --  2.7* 3.3* 3.5 3.2*  CL 113*  --  107 107 101 97*  CO2 22  --  '26 27 24 28  ' GLUCOSE 114*  --  119* 103* 102* 119*  BUN 9  --  '6 9 7 ' <5*  CREATININE 0.80  --  0.47* 0.49* 0.56* 0.53*  CALCIUM  9.3  --  8.6* 8.8* 9.5 8.8*  MG 2.0 1.8  --  1.8 2.0 1.8  PHOS 2.3* 2.9  --  3.1 2.9 3.4   GFR: Estimated Creatinine Clearance: 151.6 mL/min (A) (by C-G formula based on SCr of 0.53 mg/dL (L)). Liver Function Tests: Recent Labs  Lab 12/24/17 1245 12/28/17 1617  AST 42* 135*  ALT 38 140*  ALKPHOS 96 114  BILITOT 1.7* 1.3*  PROT 7.2 6.6  ALBUMIN 3.8 3.1*   Recent Labs  Lab 12/24/17 1829 12/30/17 0554  LIPASE 30 34  AMYLASE  --  43   No results for input(s): AMMONIA in the last 168 hours. Coagulation Profile: No results for input(s): INR, PROTIME in the last 168 hours. Cardiac Enzymes: Recent Labs  Lab 12/24/17 1829 12/25/17 0023 12/25/17 0349  CKTOTAL 794*  --   --   CKMB 1.3  --   --   TROPONINI <0.03 <0.03 <0.03   BNP (last 3 results) No results for input(s): PROBNP in the last 8760 hours. HbA1C: No results for input(s): HGBA1C in the last 72 hours. CBG: Recent Labs  Lab 12/28/17 1607 12/28/17 2022 12/29/17 0013 12/29/17 0418 12/29/17 0714  GLUCAP 100* 84 86 84 94   Lipid Profile: Recent Labs    12/27/17 1833  TRIG 217*   Thyroid Function Tests: No results for input(s): TSH, T4TOTAL, FREET4, T3FREE, THYROIDAB in the last 72 hours. Anemia Panel: No results for input(s): VITAMINB12, FOLATE, FERRITIN, TIBC, IRON, RETICCTPCT in the last 72 hours. Urine analysis:    Component Value Date/Time   COLORURINE YELLOW 12/25/2017 0339   APPEARANCEUR TURBID (A) 12/25/2017 0339   LABSPEC 1.028 12/25/2017 0339   PHURINE 6.0 12/25/2017 4650  GLUCOSEU NEGATIVE 12/25/2017 0339   HGBUR NEGATIVE 12/25/2017 0339   BILIRUBINUR NEGATIVE 12/25/2017 0339   KETONESUR 80 (A) 12/25/2017 0339   PROTEINUR NEGATIVE 12/25/2017 0339   NITRITE NEGATIVE 12/25/2017 0339   LEUKOCYTESUR NEGATIVE 12/25/2017 0339   Sepsis Labs: '@LABRCNTIP' (procalcitonin:4,lacticidven:4) ) Recent Results (from the past 240 hour(s))  Blood Culture (routine x 2)     Status: None   Collection  Time: 12/24/17 12:30 PM  Result Value Ref Range Status   Specimen Description BLOOD RIGHT ANTECUBITAL  Final   Special Requests   Final    BOTTLES DRAWN AEROBIC AND ANAEROBIC Blood Culture adequate volume   Culture   Final    NO GROWTH 5 DAYS Performed at [facility], [place] [address] [place], [place] [address]    Report Status 12/29/2017 FINAL  Final  Blood Culture (routine x 2)     Status: None   Collection Time: 12/24/17 12:32 PM  Result Value Ref Range Status   Specimen Description BLOOD LEFT ANTECUBITAL  Final   Special Requests   Final    BOTTLES DRAWN AEROBIC AND ANAEROBIC Blood Culture adequate volume   Culture   Final    NO GROWTH 5 DAYS Performed at [facility], [place] [address] [place], [place] [address]    Report Status 12/29/2017 FINAL  Final  Respiratory Panel by PCR     Status: None   Collection Time: 12/24/17  2:58 PM  Result Value Ref Range Status   Adenovirus NOT DETECTED NOT DETECTED Final   Coronavirus 229E NOT DETECTED NOT DETECTED Final   Coronavirus HKU1 NOT DETECTED NOT DETECTED Final   Coronavirus NL63 NOT DETECTED NOT DETECTED Final   Coronavirus OC43 NOT DETECTED NOT DETECTED Final   Metapneumovirus NOT DETECTED NOT DETECTED Final   Rhinovirus / Enterovirus NOT DETECTED NOT DETECTED Final   Influenza A NOT DETECTED NOT DETECTED Final   Influenza B NOT DETECTED NOT DETECTED Final   Parainfluenza Virus 1 NOT DETECTED NOT DETECTED Final   Parainfluenza Virus 2 NOT DETECTED NOT DETECTED Final   Parainfluenza Virus 3 NOT DETECTED NOT DETECTED Final   Parainfluenza Virus 4 NOT DETECTED NOT DETECTED Final   Respiratory Syncytial Virus NOT DETECTED NOT DETECTED Final   Bordetella pertussis NOT DETECTED NOT DETECTED Final   Chlamydophila pneumoniae NOT DETECTED NOT DETECTED Final   Mycoplasma pneumoniae NOT DETECTED NOT DETECTED Final    Comment: Performed at [facility], [place] [address] [place], [place] [address]  CSF culture     Status:  None   Collection Time: 12/24/17  4:07 PM  Result Value Ref Range Status   Specimen Description CSF  Final   Special Requests Normal  Final   Gram Stain   Final    WBC PRESENT, PREDOMINANTLY MONONUCLEAR NO ORGANISMS SEEN CYTOSPIN SMEAR    Culture   Final    NO GROWTH 3 DAYS Performed at [facility], [place] [address] [place], [place] [address]    Report Status 12/27/2017 FINAL  Final  MRSA PCR Screening     Status: None   Collection Time: 12/24/17  6:29 PM  Result Value Ref Range Status   MRSA by PCR NEGATIVE NEGATIVE Final    Comment:        The GeneXpert MRSA Assay (FDA approved for NASAL specimens only), is one component of a comprehensive MRSA colonization surveillance program. It is not intended to diagnose MRSA infection nor to guide or monitor treatment  for MRSA infections. Performed at [facility], [place] [address] [place], [place] [address]   Urine culture     Status: None   Collection Time: 12/25/17  3:38 AM  Result Value Ref Range Status   Specimen Description URINE, CATHETERIZED  Final   Special Requests Normal  Final   Culture   Final    NO GROWTH Performed at [facility], [address] [place], [place] [address]    Report Status 12/26/2017 FINAL  Final  Culture, blood (routine x 2)     Status: None (Preliminary result)   Collection Time: 12/28/17  1:23 PM  Result Value Ref Range Status   Specimen Description BLOOD SITE NOT SPECIFIED  Final   Special Requests   Final    BOTTLES DRAWN AEROBIC AND ANAEROBIC Blood Culture adequate volume   Culture   Final    NO GROWTH < 24 HOURS Performed at [facility], [place] [address], [place], [place] [address]    Report Status PENDING  Incomplete  Culture, blood (routine x 2)     Status: None (Preliminary result)   Collection Time: 12/28/17  1:23 PM  Result Value Ref Range Status   Specimen Description BLOOD SITE NOT SPECIFIED  Final   Special Requests   Final    BOTTLES DRAWN AEROBIC  AND ANAEROBIC Blood Culture adequate volume   Culture   Final    NO GROWTH < 24 HOURS Performed at [facility], [place] [address] [place], [place] [address]    Report Status PENDING  Incomplete         Radiology Studies: Dg Chest Port 1 View  Result Date: 12/28/2017 CLINICAL DATA:  Shortness of breath, respiratory abnormality EXAM: PORTABLE CHEST 1 VIEW COMPARISON:  Portable chest x-ray of 12/27/2016 FINDINGS: The endotracheal tube and NG tube have been removed. There is little change in aeration. Minimally prominent markings overlying the left heart border which may represent atelectasis or possibly pneumonia and clinical correlation is recommended. The heart is within normal limits in size. No bony abnormality is seen. IMPRESSION: 1. Endotracheal tube removed. 2. Minimally prominent markings medially at the left lung base may reflect atelectasis although pneumonia can not be excluded. Electronically Signed   By: Ivar Drape M.D.   On: 12/28/2017 15:45   [place] Ekg Site Rite  Result Date: 12/28/2017 If Site Rite image not attached, placement could not be confirmed due to current cardiac rhythm.     Scheduled Meds: . chlorhexidine gluconate (MEDLINE KIT)  15 mL Mouth Rinse BID  . clonazepam  0.5 mg Oral BID  . enoxaparin (LOVENOX) injection  40 mg Subcutaneous Q24H  . mouth rinse  15 mL Mouth Rinse BID  . mirtazapine  15 mg Oral QHS  . OXcarbazepine  300 mg Oral BID  . risperiDONE  3 mg Oral BID  . sodium chloride flush  10-40 mL Intracatheter Q12H   Continuous Infusions: . sodium chloride    . sodium chloride 10 mL/hr at 12/29/17 2000  . lactated ringers 100 mL/hr at 12/29/17 2000     LOS: 6 days    Time spent in minutes: 50 min- > 50 of time taken in reviewing chart, speaking with neurologist, psychiatrist, mother and on call physician in [facility], MD [facility] Pager: [email or web address] Password [email or web address] 12/30/2017, 9:12 AM

## 2017-12-30 NOTE — Progress Notes (Signed)
PT Cancellation Note  Patient Details Name: Clifford BenderDavid Mosley MRN: [id number] DOB: 1995-12-17   Cancelled Treatment:    Reason Eval/Treat Not Completed: Other (comment). Went to see pt. Pt with significant tremor. Mother present and wants pt to get his meds. Doesn't feel pt ready for mobility at this time.   Angelina OkCary W [facility] 12/30/2017, 10:30 AM Skip Mayerary Veer Elamin PT Acute Rehabilitation Services Pager [phone] Office [phone]

## 2017-12-30 NOTE — Consult Note (Addendum)
[facility] Face-to-Face Psychiatry Consult   Reason for Consult:  tremors Referring Physician:  Dr. Debbe Odea Patient Identification: Clifford Mosley MRN:  [id number] Principal Diagnosis: Altered mental status Diagnosis:  Principal Problem:   Altered mental status Active Problems:   Seizure ([place])   Fever   Combined pyramidal-extrapyramidal syndrome   Coarse tremors   Total Time spent with patient: 30 minutes  Subjective:   Clifford Mosley is a 22 y.o. male patient admitted with history of autism, remote history of seizure, who was admitted on 12/23 for fever, altered mental status; currently treated for status epilepticus. Patient was intubated for airway protection, extubated 12/26 and underwent LP with no evidence of CNS infection. Psychiatry is consulted for medication adjustment with concern for medication (risperidone) induced tremors.    HPI:   Per nursing report, he wears mitten as he tries to pull lines. He has had a few incontinence, but no significant aggressive behavior. According to RN, the mother reports that this is the second time he was placed in group home. The team requested MAR from group home (not available yet).   Patient is a limited historian due to his current mental state of drowsiness.  He is able to follow commands of stretching his arms, sticking out his tongue, and moving his 4 extremities.  He mumbles his name "Clifford Mosley" when he is asked. He is unable to tell the place he is in.  (He is unable to tell year/month/date at his baseline per his mother's report)  Patient mother, his receptive language is good while he has difficulty with expressive language use at baseline.  He is able to ride a bike, play basketball, and engages well with other people prior to moving to Group home.  The mother reports that he moved into a group home on December 15 due to his worsening aggressive behavior.  He would try to bite or kick the mother when he was asked to do something, or when he was  brought to doctor's appointments. The mother believes that his mental status remains altered since admission. He appears to be uncomfortable due to his "shaking" in his body.  Although he has hand tremors (likely postural tremors based on discription) at his baseline, she has never seen him "shaking" this way.  He has been on Risperdal 3 mg twice daily at least for one year for agitation. The mother reports that he may have refused to take medication when he was in group home. He was on haldol prn (exact dose/frequency unknown)  Medication since admission Mirtazapine 15 mg qhs 12/27- oxcarbazepine 300 mg BID Risperidone 3 mg BID 12/27- Clonazepam 0.5 mg BID 12/27- Fentanyl prn 12/24-29 midazolam prn for agitation 12/26-28   Past Psychiatric History:  Seen by psychiatrist few years ago.  Psychiatry admission in May 2017 for aggression No suicide attempt    Risk to Self:  no  Risk to Others:  no Prior Inpatient Therapy:   as above Prior Outpatient Therapy:  as above  Past Medical History:  Past Medical History:  Diagnosis Date  . Autism     Past Surgical History:  Procedure Laterality Date  . no known allergies     Family History: History reviewed. No pertinent family history. Family Psychiatric  History: denies Social History:  Social History   Substance and Sexual Activity  Alcohol Use Never  . Frequency: Never     Social History   Substance and Sexual Activity  Drug Use Never    Social History  Socioeconomic History  . Marital status: Single    Spouse name: Not on file  . Number of children: Not on file  . Years of education: Not on file  . Highest education level: Not on file  Occupational History  . Not on file  Social Needs  . Financial resource strain: Not on file  . Food insecurity:    Worry: Not on file    Inability: Not on file  . Transportation needs:    Medical: Not on file    Non-medical: Not on file  Tobacco Use  . Smoking status: Never  Smoker  . Smokeless tobacco: Never Used  Substance and Sexual Activity  . Alcohol use: Never    Frequency: Never  . Drug use: Never  . Sexual activity: Never  Lifestyle  . Physical activity:    Days per week: Not on file    Minutes per session: Not on file  . Stress: Not on file  Relationships  . Social connections:    Talks on phone: Not on file    Gets together: Not on file    Attends religious service: Not on file    Active member of club or organization: Not on file    Attends meetings of clubs or organizations: Not on file    Relationship status: Not on file  Other Topics Concern  . Not on file  Social History Narrative  . Not on file   Additional Social History:    Allergies:  No Known Allergies  Labs:  Results for orders placed or performed during the hospital encounter of 12/24/17 (from the past 48 hour(s))  Glucose, capillary     Status: Abnormal   Collection Time: 12/28/17  4:07 PM  Result Value Ref Range   Glucose-Capillary 100 (H) 70 - 99 mg/dL  Hepatic function panel     Status: Abnormal   Collection Time: 12/28/17  4:17 PM  Result Value Ref Range   Total Protein 6.6 6.5 - 8.1 g/dL   Albumin 3.1 (L) 3.5 - 5.0 g/dL   AST 135 (H) 15 - 41 U/L   ALT 140 (H) 0 - 44 U/L   Alkaline Phosphatase 114 38 - 126 U/L   Total Bilirubin 1.3 (H) 0.3 - 1.2 mg/dL   Bilirubin, Direct 0.4 (H) 0.0 - 0.2 mg/dL   Indirect Bilirubin 0.9 0.3 - 0.9 mg/dL    Comment: Performed at [facility], [place] [address], [place], [place] [address]  Glucose, capillary     Status: None   Collection Time: 12/28/17  8:22 PM  Result Value Ref Range   Glucose-Capillary 84 70 - 99 mg/dL  Glucose, capillary     Status: None   Collection Time: 12/29/17 12:13 AM  Result Value Ref Range   Glucose-Capillary 86 70 - 99 mg/dL  Glucose, capillary     Status: None   Collection Time: 12/29/17  4:18 AM  Result Value Ref Range   Glucose-Capillary 84 70 - 99 mg/dL  Glucose, capillary     Status:  None   Collection Time: 12/29/17  7:14 AM  Result Value Ref Range   Glucose-Capillary 94 70 - 99 mg/dL  CBC     Status: Abnormal   Collection Time: 12/30/17  5:54 AM  Result Value Ref Range   WBC 8.5 4.0 - 10.5 K/uL   RBC 4.28 4.22 - 5.81 MIL/uL   Hemoglobin 12.0 (L) 13.0 - 17.0 g/dL   HCT 36.4 (L) 39.0 - 52.0 %  MCV 85.0 80.0 - 100.0 fL   MCH 28.0 26.0 - 34.0 pg   MCHC 33.0 30.0 - 36.0 g/dL   RDW 12.4 11.5 - 15.5 %   Platelets 228 150 - 400 K/uL   nRBC 0.0 0.0 - 0.2 %    Comment: Performed at [facility], [place] [address] [place], [place] [address]  Basic metabolic panel     Status: Abnormal   Collection Time: 12/30/17  5:54 AM  Result Value Ref Range   Sodium 134 (L) 135 - 145 mmol/L   Potassium 3.2 (L) 3.5 - 5.1 mmol/L   Chloride 97 (L) 98 - 111 mmol/L   CO2 28 22 - 32 mmol/L   Glucose, Bld 119 (H) 70 - 99 mg/dL   BUN <5 (L) 6 - 20 mg/dL   Creatinine, Ser 0.53 (L) 0.61 - 1.24 mg/dL   Calcium 8.8 (L) 8.9 - 10.3 mg/dL   GFR calc non Af Amer >60 >60 mL/min   GFR calc Af Amer >60 >60 mL/min   Anion gap 9 5 - 15    Comment: Performed at [facility] [address], [place], [place] [address]  Magnesium     Status: None   Collection Time: 12/30/17  5:54 AM  Result Value Ref Range   Magnesium 1.8 1.7 - 2.4 mg/dL    Comment: Performed at [facility] [address], [place], [place] [address]  Phosphorus     Status: None   Collection Time: 12/30/17  5:54 AM  Result Value Ref Range   Phosphorus 3.4 2.5 - 4.6 mg/dL    Comment: Performed at [facility] [address], [place], [place] [address]  Amylase     Status: None   Collection Time: 12/30/17  5:54 AM  Result Value Ref Range   Amylase 43 28 - 100 U/L    Comment: Performed at [facility] [address] [place], [place] [address]  Lipase, blood     Status: None   Collection Time: 12/30/17  5:54 AM  Result Value Ref Range   Lipase 34 11 - 51 U/L    Comment: Performed at [facility] [address] [place], [place] [address]    Current Facility-Administered Medications  Medication Dose Route Frequency Provider Last Rate Last Dose  . 0.9 %  sodium chloride infusion  250 mL Intravenous Continuous Salvadore Dom E, NP      . 0.9 %  sodium chloride infusion   Intravenous PRN Brand Males, MD 10 mL/hr at 12/29/17 2000    . acetaminophen (TYLENOL) suppository 650 mg  650 mg Rectal Q4H PRN Mannam, Praveen, MD   650 mg at 12/28/17 1343  . acetaminophen (TYLENOL) tablet 650 mg  650 mg Oral Q6H PRN Debbe Odea, MD   650 mg at 12/30/17 1024  . chlorhexidine gluconate (MEDLINE KIT) (PERIDEX) 0.12 % solution 15 mL  15 mL Mouth Rinse BID Brand Males, MD   15 mL at 12/29/17 0745  . clonazePAM (KLONOPIN) disintegrating tablet 0.5 mg  0.5 mg Oral BID Greta Doom, MD   0.5 mg at 12/30/17 1015  . enoxaparin (LOVENOX) injection 40 mg  40 mg Subcutaneous Q24H Mannam, Praveen, MD   40 mg at 12/30/17 1015  . lactated ringers infusion   Intravenous Continuous Mannam, Praveen, MD 100 mL/hr at 12/29/17 2000    . MEDLINE mouth rinse  15 mL Mouth Rinse BID Mannam, Praveen,  MD   15 mL at 12/29/17 2234  . mirtazapine (REMERON SOL-TAB) disintegrating tablet 15 mg  15 mg Oral QHS Mannam, Praveen, MD   15 mg at 12/29/17 2232  . Oxcarbazepine (TRILEPTAL) tablet 300 mg  300 mg Oral BID Greta Doom, MD   300 mg at 12/30/17 1014  . potassium chloride SA (K-DUR,KLOR-CON) CR tablet 40 mEq  40 mEq Oral Once Debbe Odea, MD      . RESOURCE THICKENUP CLEAR   Oral PRN Rush Farmer, MD      . risperiDONE (RISPERDAL M-TABS) disintegrating tablet 3 mg  3 mg Oral BID Mannam, Praveen, MD   3 mg at 12/30/17 1014  . sodium chloride flush (NS) 0.9 % injection 10-40 mL  10-40 mL Intracatheter Q12H Mannam, Praveen, MD   10 mL at 12/29/17 2234  . sodium chloride flush (NS) 0.9 % injection 10-40 mL  10-40 mL Intracatheter PRN Mannam, Hart Robinsons, MD         Musculoskeletal: Strength & Muscle Tone: decreased Gait & Station: lying in the bed Patient leans: N/A  Psychiatric Specialty Exam: Physical Exam  Nursing note and vitals reviewed.   RASS -3, bilateral leg edema.  no cogwheel rigidity. No spasticity. Intermittent coarse tremors on bilateral arms, legs  Review of Systems  Unable to perform ROS: Mental status change    Blood pressure (!) 148/102, pulse (!) 196, temperature 98.7 F (37.1 C), temperature source Oral, resp. rate (!) 21, height 5' 7" (1.702 m), weight 84.4 kg, SpO2 96 %.Body mass index is 29.14 kg/m.  General Appearance: Fairly Groomed  Eye Contact:  Poor  Speech:  Garbled  Volume:  Decreased  Mood:  unable to answer  Affect:  (drowsy)   Thought Process:  NA unable to assess due to his drowsiness  Orientation:  Other:  able to tell his name only  Thought Content:  unable to assess due to his mental state/drowsiness  Suicidal Thoughts:  unable to assess due to his mental state/drowsiness  Homicidal Thoughts:  unable to assess due to his mental state/drowsiness  Memory:  unable to assess due to his mental state/drowsiness  Judgement:  Impaired  Insight:  unable to assess due to his mental state/drowsiness  Psychomotor Activity:  Decreased  Concentration:  Concentration: Poor and Attention Span: Poor  Recall:  unable to assess due to his mental state/drowsiness  Fund of Knowledge:  unable to assess due to his mental state/drowsiness  Language:  unable to assess due to his mental state/drowsiness  Akathisia:  No  Handed:  Right  AIMS (if indicated):     Assets:  Social Support  ADL's:  Impaired  Cognition:  Impaired,  Moderate- baseline unknown  Sleep:   fair   Assessment Clifford Mosley is a 22 y.o. male patient admitted with history of autism, remote history of seizure, who was admitted on 12/23 for fever, altered mental status; currently treated for status epilepticus. Patient was intubated for airway protection  and underwent LP with no evidence of CNS infection. Psychiatry is consulted for medication adjustment with concern for medication (risperidone) induced tremors. Per chart review, tremors are not consistent with seizure on EEG.  # Coarse tremors Exam is notable for intermittent coarse tremors on four extremities without significant rigidity. Although it is difficult to discern whether tremors are attributable to risperidone given patient has been on the current dose at least for an year (adherence unknown at group home), will try to slowly reduce the dose to see  if it alleviates his symptoms. Will monitor agitation. Will hold mirtazapine at this time given it can also contribute to tremors.   # Altered mental status Exam is notable for significant drowsiness. It is multifactorial given recent episode of seizure, sedative medication, pain and prolonged hospitalization. Continue to address the underlying medical condition and institution of preventative measures are recommended.  Of note, his mother wishes the patient to be transferred to the hospital closer to [facility] if he were to be hospitalized for more than few days.  Recommendation - Reduce risperidone M-tab 2 mg BID, and 1 mg BID PRN for agitation. If no improvement in tremors after a few days and no concern of significant behavioral issues, may try lower dose (Risperidone M-tab 1 mg BID and 1 mg BID prn for agitation)  - Discontinue mirtazapine  - Delirium precautions - Minimize/avoid deliriogenic meds including: anticholinergic, opiates, benzodiazepines           - Maintain hydration, oxygenation, nutrition           - Limit use of restraints and catheters           - Normalize sleep patterns by minimizing nighttime noise, light and interruptions by                clustering care, opening blinds during the day           - Reorient the patient frequently, provide easily visible clock and calendar           - Provide sensory aids like  glasses, hearing aids           - Encourage ambulation, regular activities and visitors to maintain cognitive stimulation   Treatment Plan Summary: Plan as above  Disposition: No evidence of imminent risk to self or others at present.   Patient does not meet criteria for psychiatric inpatient admission.  Thank you for your consult. Psychiatry will sign off this patient. Please contact psychiatry consult if you need re-evaluation or if there are any questions/concerns.   Norman Clay, MD 12/30/2017 1:30 PM

## 2017-12-31 LAB — BASIC METABOLIC PANEL
Anion gap: 10 (ref 5–15)
CO2: 28 mmol/L (ref 22–32)
CREATININE: 0.55 mg/dL — AB (ref 0.61–1.24)
Calcium: 8.9 mg/dL (ref 8.9–10.3)
Chloride: 99 mmol/L (ref 98–111)
GFR calc Af Amer: >60 mL/min (ref >60)
GFR calc non Af Amer: >60 mL/min (ref >60)
GLUCOSE: 121 mg/dL — AB (ref 70–99)
Potassium: 3 mmol/L — ABNORMAL LOW (ref 3.5–5.1)
Sodium: 137 mmol/L (ref 135–145)

## 2017-12-31 LAB — CBC
HCT: 37 % — ABNORMAL LOW (ref 39.0–52.0)
HEMATOCRIT: 39.6 % (ref 39.0–52.0)
Hemoglobin: 12.2 g/dL — ABNORMAL LOW (ref 13.0–17.0)
Hemoglobin: 13.2 g/dL (ref 13.0–17.0)
MCH: 28.6 pg (ref 26.0–34.0)
MCH: 28.8 pg (ref 26.0–34.0)
MCHC: 33 g/dL (ref 30.0–36.0)
MCHC: 33.3 g/dL (ref 30.0–36.0)
MCV: 86.7 fL (ref 80.0–100.0)
MCV: 86.3 fL (ref 80.0–100.0)
Platelets: 247 10*3/uL (ref 150–400)
Platelets: 288 10*3/uL (ref 150–400)
RBC: 4.27 MIL/uL (ref 4.22–5.81)
RBC: 4.59 MIL/uL (ref 4.22–5.81)
RDW: 12.8 % (ref 11.5–15.5)
RDW: 12.7 % (ref 11.5–15.5)
WBC: 3.8 10*3/uL — ABNORMAL LOW (ref 4.0–10.5)
WBC: 4.6 10*3/uL (ref 4.0–10.5)
nRBC: 0 % (ref 0.0–0.2)
nRBC: 0 % (ref 0.0–0.2)

## 2017-12-31 LAB — MAGNESIUM: Magnesium: 2 mg/dL (ref 1.7–2.4)

## 2017-12-31 MED ORDER — DOUBLE ANTIBIOTIC 500-10000 UNIT/GM EX OINT
1.0000 "application " | TOPICAL_OINTMENT | Freq: Every day | CUTANEOUS | Status: DC
  Administered 2017-12-31: 1 via TOPICAL
  Filled 2017-12-31: qty 28.4

## 2017-12-31 MED ORDER — OXCARBAZEPINE 300 MG PO TABS: 300.0000 mg | ORAL_TABLET | Freq: Two times a day (BID) | ORAL | 0 refills | Status: AC

## 2017-12-31 MED ORDER — CLONAZEPAM 0.5 MG PO TBDP: 0.5000 mg | ORAL_TABLET | Freq: Two times a day (BID) | ORAL | 0 refills | Status: AC

## 2017-12-31 MED ORDER — RISPERIDONE 2 MG PO TBDP: 2.0000 mg | ORAL_TABLET | Freq: Two times a day (BID) | ORAL | 0 refills | Status: AC

## 2017-12-31 MED ORDER — POTASSIUM CHLORIDE CRYS ER 20 MEQ PO TBCR
40.0000 meq | EXTENDED_RELEASE_TABLET | ORAL | Status: DC
  Administered 2017-12-31: 40 meq via ORAL
  Filled 2017-12-31: qty 2

## 2017-12-31 NOTE — Clinical Social Work Note (Signed)
CSW talked with mother, Haskell RilingCaryn Manley regarding her son and the discharge plan. Advised mother that Medicaid does not pay for therapies at a SNF or home and this was discussed. Provided with mother an estimate of how much therapy (PT/OT) would cost in a SNF and nurse case manager provided mother with list of HH agencies in [place] area that she can contact [facility]. Ms. Arnoldo MoraleManley decided that she will take her son home this evening. CSW signing off as no SW intervention services needed.  Genelle BalVanessa Brigido Mera, MSW, LCSW Licensed Clinical Social Worker Clinical Social Work Department [facility] [phone]

## 2017-12-31 NOTE — Progress Notes (Addendum)
PROGRESS NOTE    Clifford Mosley   SHF:[id number]  DOB: 16-Aug-1995  DOA: 12/24/2017 PCP: System, Pcp Not In   Brief Narrative:  Clifford Mosley is a 22 year old male patient with history of autism and seizure disorder admitted 12/23 for status epilepticus, and possible sepsis with concern for CNS infection. At baseline he is minimally verbal, he is able to speak in 1-4 word phrases, at approximately "69-year-old level" per his mother he is ambulatory, able to eat on his own.  He has been living with his mother and step father in [place] until about 1 wk ago when he was transitioned to a group home in [place] as he was becoming increasingly agitated at home and difficult to take care of. He has significant behavioral problems, typically becomes combative when agitated. His mother states that he was declining his Risperdal at the group home and he was not given it. She also thinks he was receiving Haldol as needed at the home.   Per PCCM note>  On the 21st care provider noted some mild tremors, EMS was called, they could find no other abnormalities, he apparently had some history of tremor in the past so they felt no further evaluation was warranted.  On the 23rd his care provider evaluated him and found him to be more lethargic, more withdrawn, nonverbal, and diaphoretic EMS was summoned.   On arrival his temperature was greater than 101, he was obtunded, and shortly after demonstrated bilateral upper and lower extremity rhythmic seizure-like activity.   Intubated for airway protection and admitted to ICU, underwent lumbar puncture with no evidence of CNS infection. Antibiotics discontinued on 12/25.  Extubated on 12/26   Subjective: Per his mother, he is eating and drinking now. He is in a great deal of pain and is agitated by his PICC line. She is asking for it to be removed as all his medications are being given by mouth. She feels he is doing a little better.      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 - given Keppra, Dilantin and then transitioned to Trileptal on 12/27- neuro recommends to continue Clonopin and hold Adderal - d/w Neuro yesterday and again today  Active Problems:   Fever - no source found- has not recurred after antibiotics stopped - CXR unrevealing- blood cultures negative, CXF culture negative, Resp panel negative, Urine culture negative  Diarrhea - just started last night abdomen soft and benign on exam- Xray unrevealing- has only been on liquids and has not had any solid food- no laxatives given - will monitor without intervention for now  "gagging on food" - have asked SLP to re-eval- he is on D3 with Nectar thick liquids but only being fed liquids and applesauce by mother - cont IVF for now - 12/30- per mother, he is able to eat appropriately now- awaiting SLP to see if diet can be down graded from D3, nectar thick    Coarse upper extermity tremors - he has had a milder tremor for a while as outpt per his parents - 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  5) could increase clonopin if tolerated in a week or so if tremor is still prominant.  6) will need outpatient follow up with psych and neuro  Psych has tapered Risperidone - further Psych recommendations> Reduce risperidone M-tab 2 mg BID, and 1 mg BID PRN for agitation. If no improvement in tremors after a few days and no concern of significant behavioral issues, may try lower dose  (Risperidone M-tab 1 mg BID and 1 mg BID prn for agitation)  - Discontinue mirtazapine   Severe weakness - was able to walk prior to admission but now cannot stand up on his own - recommended to go to CIR per PT- his mother would like to take him to a SNF in [place] have consulted social work  DVT prophylaxis: Lovenox Code Status: Full code Family Communication: mother and step father at bedside Disposition Plan: Remove PICC, SW consulted for SNF placement Consultants:   Admitted to PCCM  Neuro  Psych Procedures:   Intubation/ Extubation  EEG  LP Antimicrobials:  Anti-infectives (From admission, onward)   Start     Dose/Rate Route Frequency Ordered Stop   12/26/17 1000  cefTRIAXone (ROCEPHIN) 2 g in sodium chloride 0.9 % 100 mL IVPB  Status:  Discontinued     2 g 200 mL/hr over 30 Minutes Intravenous Every 24 hours 12/25/17 1118 12/25/17 1119   12/26/17 1000  cefTRIAXone (ROCEPHIN) 2 g in sodium chloride 0.9 % 100 mL IVPB  Status:  Discontinued     2 g 200 mL/hr over 30 Minutes Intravenous Every 24 hours 12/25/17 1119 12/26/17 1157   12/25/17 2000  vancomycin (VANCOCIN) IVPB 1000 mg/200 mL premix  Status:  Discontinued     1,000 mg 200 mL/hr over 60 Minutes Intravenous Every 12 hours 12/25/17 1118 12/26/17 1157   12/25/17 0800  vancomycin (VANCOCIN) IVPB 1000 mg/200 mL premix  Status:  Discontinued     1,000 mg 200 mL/hr over 60 Minutes Intravenous Every 8 hours 12/25/17 0734 12/25/17 1032   12/25/17 0100  ceFEPIme (MAXIPIME) 2 g in sodium chloride 0.9 % 100 mL IVPB  Status:  Discontinued     2 g 200 mL/hr over 30 Minutes Intravenous Every 12 hours 12/24/17 1353 12/24/17 1526   12/25/17 0100  cefTRIAXone (ROCEPHIN) 2 g in sodium chloride 0.9 % 100 mL IVPB  Status:  Discontinued     2 g 200 mL/hr over 30 Minutes Intravenous Every 12 hours 12/24/17 1531 12/25/17 1032   12/24/17 2200  vancomycin (VANCOCIN) IVPB 1000 mg/200 mL premix  Status:  Discontinued     1,000  mg 200 mL/hr over 60 Minutes Intravenous Every 12 hours 12/24/17 1353 12/25/17 0734   12/24/17 1600  ampicillin (OMNIPEN) 2 g in sodium chloride 0.9 % 100 mL IVPB  Status:  Discontinued     2 g 300 mL/hr over 20 Minutes Intravenous Every 4 hours 12/24/17 1526 12/24/17 1533   12/24/17 1230  ceFEPIme (MAXIPIME) 2 g in sodium chloride 0.9 % 100 mL IVPB     2 g 200 mL/hr over 30 Minutes Intravenous  Once 12/24/17 1222 12/24/17 1336   12/24/17 1230  metroNIDAZOLE (FLAGYL) IVPB 500 mg  Status:  Discontinued     500 mg 100 mL/hr over 60 Minutes Intravenous Every 8 hours 12/24/17 1222 12/24/17 1526   12/24/17 1230  vancomycin (VANCOCIN) IVPB  1000 mg/200 mL premix     1,000 mg 200 mL/hr over 60 Minutes Intravenous  Once 12/24/17 1222 12/24/17 1451       Objective: Vitals:   12/30/17 1802 12/30/17 2040 12/31/17 0545 12/31/17 0915  BP: 129/79 (!) 138/105 96/61 (!) 163/85  Pulse: (!) 101 (!) 107 96 (!) 108  Resp: _0 Temp: 98.2 F (36.8 C) 98.3 F (36.8 C) 98.4 F (36.9 C) (!) 97.3 F (36.3 C)  TempSrc: Oral Oral Oral Oral  SpO2: 98% 99% 96%   Weight:  84.3 kg    Height:        Intake/Output Summary (Last 24 hours) at 12/31/2017 1350 Last data filed at 12/31/2017 0900 Gross per 24 hour  Intake 160 ml  Output 25 ml  Net 135 ml   Filed Weights   12/29/17 0434 12/29/17 2239 12/30/17 2040  Weight: 84.5 kg 84.4 kg 84.3 kg    Examination: General exam:  Nonverbal- On my eval he is calm, has a moderate upper extermity termor, again, points to left arm where PICC is when asked about pain HEENT: PERRL, oral mucosa moist, no sclera icterus or thrush Respiratory system: Clear to auscultation. Respiratory effort normal. Cardiovascular system: S1 & S2 heard, RRR.   Gastrointestinal system: Abdomen soft, remains non-tender, nondistended. Normal bowel sounds. Central nervous system: Alert - arms trembling. Extremities: No cyanosis, clubbing or edema Skin: No rashes or  ulcers Psychiatry:  Mood & affect cannot be assessed    Data Reviewed: I have personally reviewed following labs and imaging studies  CBC: Recent Labs  Lab 12/27/17 0420 12/28/17 0236 12/30/17 0554 12/31/17 0429 12/31/17 1022  WBC 5.0 8.4 8.5 3.8* 4.6  HGB 11.4* 13.6 12.0* 12.2* 13.2  HCT 35.1* 40.9 36.4* 37.0* 39.6  MCV 87.1 85.2 85.0 86.7 86.3  PLT 148* 253 228 247 381   Basic Metabolic Panel: Recent Labs  Lab 12/25/17 0349 12/26/17 0407 12/26/17 1119 12/27/17 0420 12/28/17 0236 12/30/17 0554 12/31/17 0429  NA 149*  --  144 142 137 134* 137  K 3.4*  --  2.7* 3.3* 3.5 3.2* 3.0*  CL 113*  --  107 107 101 97* 99  CO2 22  --  _1 GLUCOSE 114*  --  119* 103* 102* 119* 121*  BUN 9  --  _2 <5* <5*  CREATININE 0.80  --  0.47* 0.49* 0.56* 0.53* 0.55*  CALCIUM 9.3  --  8.6* 8.8* 9.5 8.8* 8.9  MG 2.0 1.8  --  1.8 2.0 1.8  --   PHOS 2.3* 2.9  --  3.1 2.9 3.4  --    GFR: Estimated Creatinine Clearance: 151.6 mL/min (A) (by C-G formula based on SCr of 0.55 mg/dL (L)). Liver Function Tests: Recent Labs  Lab 12/28/17 1617  AST 135*  ALT 140*  ALKPHOS 114  BILITOT 1.3*  PROT 6.6  ALBUMIN 3.1*   Recent Labs  Lab 12/24/17 1829 12/30/17 0554  LIPASE 30 34  AMYLASE  --  43   No results for input(s): AMMONIA in the last 168 hours. Coagulation Profile: No results for input(s): INR, PROTIME in the last 168 hours. Cardiac Enzymes: Recent Labs  Lab 12/24/17 1829 12/25/17 0023 12/25/17 0349  CKTOTAL 794*  --   --   CKMB 1.3  --   --   TROPONINI <0.03 <0.03 <0.03   BNP (last 3 results) No results for input(s): PROBNP in the last 8760 hours.  HbA1C: No results for input(s): HGBA1C in the last 72 hours. CBG: Recent Labs  Lab 12/28/17 1607 12/28/17 2022 12/29/17 0013 12/29/17 0418 12/29/17 0714  GLUCAP 100* 84 86 84 94   Lipid Profile: No results for input(s): CHOL, HDL, LDLCALC, TRIG, CHOLHDL, LDLDIRECT in the last 72 hours. Thyroid  Function Tests: No results for input(s): TSH, T4TOTAL, FREET4, T3FREE, THYROIDAB in the last 72 hours. Anemia Panel: No results for input(s): VITAMINB12, FOLATE, FERRITIN, TIBC, IRON, RETICCTPCT in the last 72 hours. Urine analysis:    Component Value Date/Time   COLORURINE YELLOW 12/25/2017 0339   APPEARANCEUR TURBID (A) 12/25/2017 0339   LABSPEC 1.028 12/25/2017 0339   PHURINE 6.0 12/25/2017 0339   GLUCOSEU NEGATIVE 12/25/2017 0339   HGBUR NEGATIVE 12/25/2017 0339   BILIRUBINUR NEGATIVE 12/25/2017 0339   KETONESUR 80 (A) 12/25/2017 0339   PROTEINUR NEGATIVE 12/25/2017 0339   NITRITE NEGATIVE 12/25/2017 0339   LEUKOCYTESUR NEGATIVE 12/25/2017 0339   Sepsis Labs: _0 (procalcitonin:4,lacticidven:4) ) Recent Results (from the past 240 hour(s))  Blood Culture (routine x 2)     Status: None   Collection Time: 12/24/17 12:30 PM  Result Value Ref Range Status   Specimen Description BLOOD RIGHT ANTECUBITAL  Final   Special Requests   Final    BOTTLES DRAWN AEROBIC AND ANAEROBIC Blood Culture adequate volume   Culture   Final    NO GROWTH 5 DAYS Performed at [facility], [place] [address] [place], [place] [address]    Report Status 12/29/2017 FINAL  Final  Blood Culture (routine x 2)     Status: None   Collection Time: 12/24/17 12:32 PM  Result Value Ref Range Status   Specimen Description BLOOD LEFT ANTECUBITAL  Final   Special Requests   Final    BOTTLES DRAWN AEROBIC AND ANAEROBIC Blood Culture adequate volume   Culture   Final    NO GROWTH 5 DAYS Performed at [facility], [place] [address], [place], [place] [address]    Report Status 12/29/2017 FINAL  Final  Respiratory Panel by PCR     Status: None   Collection Time: 12/24/17  2:58 PM  Result Value Ref Range Status   Adenovirus NOT DETECTED NOT DETECTED Final   Coronavirus 229E NOT DETECTED NOT DETECTED Final   Coronavirus HKU1 NOT DETECTED NOT DETECTED Final   Coronavirus NL63 NOT DETECTED NOT DETECTED  Final   Coronavirus OC43 NOT DETECTED NOT DETECTED Final   Metapneumovirus NOT DETECTED NOT DETECTED Final   Rhinovirus / Enterovirus NOT DETECTED NOT DETECTED Final   Influenza A NOT DETECTED NOT DETECTED Final   Influenza B NOT DETECTED NOT DETECTED Final   Parainfluenza Virus 1 NOT DETECTED NOT DETECTED Final   Parainfluenza Virus 2 NOT DETECTED NOT DETECTED Final   Parainfluenza Virus 3 NOT DETECTED NOT DETECTED Final   Parainfluenza Virus 4 NOT DETECTED NOT DETECTED Final   Respiratory Syncytial Virus NOT DETECTED NOT DETECTED Final   Bordetella pertussis NOT DETECTED NOT DETECTED Final   Chlamydophila pneumoniae NOT DETECTED NOT DETECTED Final   Mycoplasma pneumoniae NOT DETECTED NOT DETECTED Final    Comment: Performed at [facility], [place] [address] [place], [place] [address]  CSF culture     Status: None   Collection Time: 12/24/17  4:07 PM  Result Value Ref Range Status   Specimen Description CSF  Final   Special Requests Normal  Final   Gram Stain   Final    WBC  PRESENT, PREDOMINANTLY MONONUCLEAR NO ORGANISMS SEEN CYTOSPIN SMEAR    Culture   Final    NO GROWTH 3 DAYS Performed at [facility], [place] [address], [place], [place] [address]    Report Status 12/27/2017 FINAL  Final  MRSA PCR Screening     Status: None   Collection Time: 12/24/17  6:29 PM  Result Value Ref Range Status   MRSA by PCR NEGATIVE NEGATIVE Final    Comment:        The GeneXpert MRSA Assay (FDA approved for NASAL specimens only), is one component of a comprehensive MRSA colonization surveillance program. It is not intended to diagnose MRSA infection nor to guide or monitor treatment for MRSA infections. Performed at [facility], [place] [address] [place], [place] [address]   Urine culture     Status: None   Collection Time: 12/25/17  3:38 AM  Result Value Ref Range Status   Specimen Description URINE, CATHETERIZED  Final   Special Requests Normal  Final   Culture    Final    NO GROWTH Performed at [facility], [address] [place], [place] [address]    Report Status 12/26/2017 FINAL  Final  Culture, blood (routine x 2)     Status: None (Preliminary result)   Collection Time: 12/28/17  1:23 PM  Result Value Ref Range Status   Specimen Description BLOOD SITE NOT SPECIFIED  Final   Special Requests   Final    BOTTLES DRAWN AEROBIC AND ANAEROBIC Blood Culture adequate volume   Culture   Final    NO GROWTH 2 DAYS Performed at [facility], [place] [address] [place], [place] [address]    Report Status PENDING  Incomplete  Culture, blood (routine x 2)     Status: None (Preliminary result)   Collection Time: 12/28/17  1:23 PM  Result Value Ref Range Status   Specimen Description BLOOD SITE NOT SPECIFIED  Final   Special Requests   Final    BOTTLES DRAWN AEROBIC AND ANAEROBIC Blood Culture adequate volume   Culture   Final    NO GROWTH 2 DAYS Performed at [facility], [place] [address], [place], [place] [address]    Report Status PENDING  Incomplete         Radiology Studies: Dg Abd 1 View  Result Date: 12/30/2017 CLINICAL DATA:  Pt is having abdominal pain and point to the epigastric region. His mother, Santiago Glad assisted throughout. She stated he has been having massive diarrhea. Pt is unable to stand or use the bed pan. EXAM: ABDOMEN - 1 VIEW COMPARISON:  None. FINDINGS: There is marked gaseous distension of the stomach. Otherwise, bowel gas pattern is nonobstructive. Partially imaged central line tip to the LOWER RIGHT atrium. IMPRESSION: Marked gaseous distension of the stomach. Electronically Signed   By: Nolon Nations M.D.   On: 12/30/2017 10:16      Scheduled Meds: . chlorhexidine gluconate (MEDLINE KIT)  15 mL Mouth Rinse BID  . clonazepam  0.5 mg Oral BID  . enoxaparin (LOVENOX) injection  40 mg Subcutaneous Q24H  . mouth rinse  15 mL Mouth Rinse BID  . mirtazapine  15 mg Oral QHS  . OXcarbazepine  300 mg Oral  BID  . risperiDONE  2 mg Oral BID  . sodium chloride flush  10-40 mL Intracatheter Q12H   Continuous Infusions: . sodium chloride    . sodium chloride 10 mL/hr at 12/29/17 2000  LOS: 7 days    Time spent in minutes: 50 min- > 50 of time taken in reviewing chart, speaking with neurologist, psychiatrist, mother and on call physician in [facility], [facility] Pager: [email or web address] Password [email or web address] 12/31/2017, 1:50 PM

## 2017-12-31 NOTE — Discharge Summary (Signed)
Physician Discharge Summary  [name] QIO:[id number] DOB: [birth year] DOA: 12/24/2017  PCP: System, Pcp Not In  Admit date: 12/24/2017 Discharge date: 12/31/2017  Admitted From: group home  Disposition:  Home with mother   Recommendations for Outpatient Follow-up:  1. F/u with psych and Neuro   Discharge Condition:  stable   Diet recommendation:  regular Code Status: Full code Consultants:   Admitted to PCCM  Neuro  Psych    Discharge Diagnoses:  Active Problems:   Seizure (HCC)   Fever   Combined pyramidal-extrapyramidal syndrome   Coarse tremors      Brief Summary: Clifford Mosley is a 22 year old male patient with history of autism and seizure disorder brought from group home for increased confusion, fevers, chills and "shaking". Per his mother, the group home was not giving him his Risperidone if he did not want to take it. It is a medication he was on since age 2. They also started to give him Haldol PRN at the facility.   Admitted 12/23 for status epilepticus, and possible sepsis with concern for CNS infection. At baseline he is minimally verbal, he is able to speak in 1-4 word phrases, at approximately "24-year-old level" per his mother he is ambulatory, able to eat on his own.  He has been living with his mother and step father in [place] until about 1 wk ago when he was transitioned to a group home in [place] as he was becoming increasingly agitated at home and difficult to take care of.He has significant behavioral problems, typically becomes combative when agitated. His mother states that he was declining his Risperdal at the group home and he was not given it. She also thinks he was receiving Haldol as needed at the home.   Per PCCM note>  On the 21st care provider noted some mild tremors, EMS was called, they could find no other abnormalities, he apparently had some history of tremor in the past so they felt no further evaluation was warranted. On the 23rd  his care provider evaluated him and found him to be more lethargic, more withdrawn, nonverbal, and diaphoretic EMS was summoned.  On arrival his temperature was greater than 101, he was obtunded, and shortly after demonstrated bilateral upper and lower extremity rhythmic seizure-like activity.   Intubated for airway protection and admitted to ICU, underwent lumbar puncture with no evidence of CNS infection. Antibiotics discontinued on 12/25.  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 for which he was on Trileptal - given Keppra, Dilantin and then transitioned to Trileptal on 12/27- neuro recommends to continue Clonopin and hold Adderal - d/w Neuro yesterday and again today  Active Problems:   Fever - no source found- has not recurred after antibiotics stopped - CXR unrevealing- blood cultures negative, CXF culture negative, Resp panel negative, Urine culture negative  Diarrhea - just started last night abdomen soft and benign on exam- Xray unrevealing- has only been on liquids and has not had any solid food- no laxatives given - will monitor without intervention for now  "gagging on food" - have asked SLP to re-eval- he is on D3 with Nectar thick liquids but only being fed liquids and applesauce by mother - cont IVF for now - 12/30- per mother, he is able to eat appropriately now- awaiting SLP to see if diet can be down graded from D3, nectar  thick    Coarse upper extermity tremors - he has had a milder tremor for a while as outpt per his parents - 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  5) could increase clonopin if tolerated in a week or so if tremor is still prominant.  6) will need outpatient follow up with psych and neuro  Psych has tapered Risperidone - further Psych recommendations> Reduce risperidone M-tab 2 mg BID, and 1 mg BID PRN for agitation. If no improvement in tremors after a few days and no concern of significant behavioral issues, may try lower dose (Risperidone M-tab 1 mg BID and 1 mg BID prn for agitation)  - Discontinue mirtazapine(started in the hospital and was not a home med)  Severe weakness - was able to walk prior to admission but now cannot stand up on his own - recommended to go to CIR per PT- his mother would like to take him to a SNF in [place]- I have consulted social work - unfortunately, they have been told today by SW that Medicaid will not pay for SNF- the family has decided to take him home    Discharge Exam: Vitals:   12/31/17 0915 12/31/17 1732  BP: (!) 163/85 (!) 137/103  Pulse: (!) 108 (!) 105  Resp: 19 19  Temp: (!) 97.3 F (36.3 C) 97.7 F (36.5 C)  SpO2:  96%   Vitals:   12/30/17 2040 12/31/17 0545 12/31/17 0915 12/31/17 1732  BP:  96/61 (!) 163/85 (!) 137/103  Pulse:  96 (!) 108 (!) 105  Resp:  17 19 19   Temp:  98.4 F (36.9 C) (!) 97.3 F (36.3 C) 97.7 F (36.5 C)  TempSrc:  Oral Oral Oral  SpO2: 99% 96%  96%  Weight: 84.3 kg     Height:        General: Pt is alert, awake, not in acute distress- has a moderate upper extremity tremor Cardiovascular: RRR, S1/S2 +, no rubs, no gallops Respiratory: CTA bilaterally, no wheezing, no rhonchi Abdominal: Soft, NT, ND, bowel sounds + Extremities: no edema, no cyanosis   Discharge Instructions  Discharge Instructions    Diet - low sodium heart healthy   Complete by:  As directed    Increase activity slowly   Complete by:  As directed      Allergies as of  12/31/2017   No Known Allergies     Medication List    STOP taking these medications   amphetamine-dextroamphetamine 20 MG tablet Commonly known as:  ADDERALL   haloperidol 5 MG tablet Commonly known as:  HALDOL   risperiDONE 3 MG tablet Commonly known as:  RISPERDAL Replaced by:  risperiDONE 2 MG disintegrating tablet     TAKE these medications   clonazePAM 0.5 MG disintegrating tablet Commonly known as:  KLONOPIN Take 1 tablet (0.5 mg total) by mouth 2 (two) times daily.   Oxcarbazepine 300 MG tablet Commonly known as:  TRILEPTAL Take 1 tablet (300 mg total) by mouth 2 (two) times daily.   risperiDONE 2 MG disintegrating tablet Commonly known as:  RISPERDAL M-TABS Take 1 tablet (2 mg total) by  mouth 2 (two) times daily. Replaces:  risperiDONE 3 MG tablet       No Known Allergies   Procedures/Studies:  Intubation/ Extubation  EEG LP   Dg Abd 1 View  Result Date: 12/30/2017 CLINICAL DATA:  Pt is having abdominal pain and point to the epigastric region. His mother, Clydie Braun assisted throughout. She stated he has been having massive diarrhea. Pt is unable to stand or use the bed pan. EXAM: ABDOMEN - 1 VIEW COMPARISON:  None. FINDINGS: There is marked gaseous distension of the stomach. Otherwise, bowel gas pattern is nonobstructive. Partially imaged central line tip to the LOWER RIGHT atrium. IMPRESSION: Marked gaseous distension of the stomach. Electronically Signed   By: Norva Pavlov M.D.   On: 12/30/2017 10:16   Ct Head Wo Contrast  Result Date: 12/24/2017 CLINICAL DATA:  Altered mental status EXAM: CT HEAD WITHOUT CONTRAST TECHNIQUE: Contiguous axial images were obtained from the base of the skull through the vertex without intravenous contrast. COMPARISON:  None. FINDINGS: Brain: There is no mass, hemorrhage or extra-axial collection. The size and configuration of the ventricles and extra-axial CSF spaces are normal. The brain parenchyma is normal, without  evidence of acute or chronic infarction. Vascular: No abnormal hyperdensity of the major intracranial arteries or dural venous sinuses. No intracranial atherosclerosis. Skull: The visualized skull base, calvarium and extracranial soft tissues are normal. Sinuses/Orbits: No fluid levels or advanced mucosal thickening of the visualized paranasal sinuses. No mastoid or middle ear effusion. The orbits are normal. IMPRESSION: Normal head CT. Electronically Signed   By: Deatra Robinson M.D.   On: 12/24/2017 14:52   Mr Laqueta Jean ZO Contrast  Result Date: 12/27/2017 CLINICAL DATA:  Seizure and fever, status post lumbar puncture. Assess for toxic metabolic encephalopathy. EXAM: MRI HEAD WITHOUT AND WITH CONTRAST TECHNIQUE: Multiplanar, multiecho pulse sequences of the brain and surrounding structures were obtained without and with intravenous contrast. CONTRAST:  10 cc Gadavist COMPARISON:  CT HEAD December 24, 2017 FINDINGS: INTRACRANIAL CONTENTS: No reduced diffusion to suggest acute ischemia or hyperacute demyelination. No susceptibility artifact to suggest hemorrhage. Borderline parenchymal brain volume loss for age. No suspicious parenchymal signal, masses, mass effect. No abnormal intraparenchymal or extra-axial enhancement. No abnormal extra-axial fluid collections. No extra-axial masses. Relatively symmetric hippocampal size, morphology and signal. VASCULAR: Normal major intracranial vascular flow voids present at skull base. SKULL AND UPPER CERVICAL SPINE: No abnormal sellar expansion. No suspicious calvarial bone marrow signal. Prominent inion is a normal variant. Craniocervical junction maintained. SINUSES/ORBITS: Small mastoid effusions.The included ocular globes and orbital contents are non-suspicious. OTHER: Life support lines in place. IMPRESSION: 1. No acute intracranial process. 2. Borderline parenchymal brain volume loss for age. Electronically Signed   By: Awilda Metro M.D.   On: 12/27/2017 00:02    Dg Chest Port 1 View  Result Date: 12/28/2017 CLINICAL DATA:  Shortness of breath, respiratory abnormality EXAM: PORTABLE CHEST 1 VIEW COMPARISON:  Portable chest x-ray of 12/27/2016 FINDINGS: The endotracheal tube and NG tube have been removed. There is little change in aeration. Minimally prominent markings overlying the left heart border which may represent atelectasis or possibly pneumonia and clinical correlation is recommended. The heart is within normal limits in size. No bony abnormality is seen. IMPRESSION: 1. Endotracheal tube removed. 2. Minimally prominent markings medially at the left lung base may reflect atelectasis although pneumonia can not be excluded. Electronically Signed   By: Dwyane Dee M.D.   On: 12/28/2017 15:45   Dg Chest Port 1  View  Result Date: 12/27/2017 CLINICAL DATA:  Acute respiratory failure EXAM: PORTABLE CHEST 1 VIEW COMPARISON:  12/25/2017 FINDINGS: Endotracheal tube 2.5 cm above the carina.  NG tube in the stomach. Lungs remain clear without infiltrate effusion or edema. IMPRESSION: Endotracheal tube in good position.  Lungs remain clear. Electronically Signed   By: Marlan Palau M.D.   On: 12/27/2017 06:48   Dg Chest Port 1 View  Result Date: 12/25/2017 CLINICAL DATA:  Respiratory failure. Shortness of breath. EXAM: PORTABLE CHEST 1 VIEW COMPARISON:  None. FINDINGS: Support tubes and lines are stable. Stable cardiomediastinal silhouette. Clear lung fields. No pneumothorax or effusion. IMPRESSION: Stable chest. Electronically Signed   By: Elsie Stain M.D.   On: 12/25/2017 07:12   Dg Chest Portable 1 View  Result Date: 12/24/2017 CLINICAL DATA:  Intubation EXAM: PORTABLE CHEST 1 VIEW COMPARISON:  12/24/2017 at 12:27 p.m. FINDINGS: Support Apparatus: --Endotracheal tube: Tip at the level of the clavicular heads. --Enteric tube:Coiled within the stomach --Catheter(s):None --Other: None The heart size and mediastinal contours are within normal limits. The  lungs are clear. No pleural effusion or pneumothorax. IMPRESSION: 1. No acute pulmonary process. 2. Endotracheal tube tip at the level of the clavicles and enteric tube tip and side port within the stomach. Electronically Signed   By: Deatra Robinson M.D.   On: 12/24/2017 14:20   Dg Chest Portable 1 View  Result Date: 12/24/2017 CLINICAL DATA:  Sepsis. EXAM: PORTABLE CHEST 1 VIEW COMPARISON:  None. FINDINGS: The cardiac silhouette, mediastinal and hilar contours are within normal limits given the low lung volumes and AP projection. The lungs are clear. No pleural effusion. Slightly dilated air-filled bowel noted in the upper abdomen. No free air. IMPRESSION: No acute cardiopulmonary findings. Electronically Signed   By: Rudie Meyer M.D.   On: 12/24/2017 12:51   [place] Ekg Site Rite  Result Date: 12/28/2017 If Site Rite image not attached, placement could not be confirmed due to current cardiac rhythm.    The results of significant diagnostics from this hospitalization (including imaging, microbiology, ancillary and laboratory) are listed below for reference.     Microbiology: Recent Results (from the past 240 hour(s))  Blood Culture (routine x 2)     Status: None   Collection Time: 12/24/17 12:30 PM  Result Value Ref Range Status   Specimen Description BLOOD RIGHT ANTECUBITAL  Final   Special Requests   Final    BOTTLES DRAWN AEROBIC AND ANAEROBIC Blood Culture adequate volume   Culture   Final    NO GROWTH 5 DAYS Performed at [facility], [address], [place], [place] [address]    Report Status 12/29/2017 FINAL  Final  Blood Culture (routine x 2)     Status: None   Collection Time: 12/24/17 12:32 PM  Result Value Ref Range Status   Specimen Description BLOOD LEFT ANTECUBITAL  Final   Special Requests   Final    BOTTLES DRAWN AEROBIC AND ANAEROBIC Blood Culture adequate volume   Culture   Final    NO GROWTH 5 DAYS Performed at [facility], [address],  [place], [place] [address]    Report Status 12/29/2017 FINAL  Final  Respiratory Panel by PCR     Status: None   Collection Time: 12/24/17  2:58 PM  Result Value Ref Range Status   Adenovirus NOT DETECTED NOT DETECTED Final   Coronavirus 229E NOT DETECTED NOT DETECTED Final   Coronavirus HKU1 NOT DETECTED NOT  DETECTED Final   Coronavirus NL63 NOT DETECTED NOT DETECTED Final   Coronavirus OC43 NOT DETECTED NOT DETECTED Final   Metapneumovirus NOT DETECTED NOT DETECTED Final   Rhinovirus / Enterovirus NOT DETECTED NOT DETECTED Final   Influenza A NOT DETECTED NOT DETECTED Final   Influenza B NOT DETECTED NOT DETECTED Final   Parainfluenza Virus 1 NOT DETECTED NOT DETECTED Final   Parainfluenza Virus 2 NOT DETECTED NOT DETECTED Final   Parainfluenza Virus 3 NOT DETECTED NOT DETECTED Final   Parainfluenza Virus 4 NOT DETECTED NOT DETECTED Final   Respiratory Syncytial Virus NOT DETECTED NOT DETECTED Final   Bordetella pertussis NOT DETECTED NOT DETECTED Final   Chlamydophila pneumoniae NOT DETECTED NOT DETECTED Final   Mycoplasma pneumoniae NOT DETECTED NOT DETECTED Final    Comment: Performed at [facility], [address] [place], [place] [address]  CSF culture     Status: None   Collection Time: 12/24/17  4:07 PM  Result Value Ref Range Status   Specimen Description CSF  Final   Special Requests Normal  Final   Gram Stain   Final    WBC PRESENT, PREDOMINANTLY MONONUCLEAR NO ORGANISMS SEEN CYTOSPIN SMEAR    Culture   Final    NO GROWTH 3 DAYS Performed at [facility], [address] [place], [place] [address]    Report Status 12/27/2017 FINAL  Final  MRSA PCR Screening     Status: None   Collection Time: 12/24/17  6:29 PM  Result Value Ref Range Status   MRSA by PCR NEGATIVE NEGATIVE Final    Comment:        The GeneXpert MRSA Assay (FDA approved for NASAL specimens only), is one component of a comprehensive MRSA colonization surveillance program. It is  not intended to diagnose MRSA infection nor to guide or monitor treatment for MRSA infections. Performed at [facility], [address] [place], [place] [address]   Urine culture     Status: None   Collection Time: 12/25/17  3:38 AM  Result Value Ref Range Status   Specimen Description URINE, CATHETERIZED  Final   Special Requests Normal  Final   Culture   Final    NO GROWTH Performed at [facility], [address] [place], [place] [address]    Report Status 12/26/2017 FINAL  Final  Culture, blood (routine x 2)     Status: None (Preliminary result)   Collection Time: 12/28/17  1:23 PM  Result Value Ref Range Status   Specimen Description BLOOD SITE NOT SPECIFIED  Final   Special Requests   Final    BOTTLES DRAWN AEROBIC AND ANAEROBIC Blood Culture adequate volume   Culture   Final    NO GROWTH 3 DAYS Performed at [facility], [address] [place], [place] [address]    Report Status PENDING  Incomplete  Culture, blood (routine x 2)     Status: None (Preliminary result)   Collection Time: 12/28/17  1:23 PM  Result Value Ref Range Status   Specimen Description BLOOD SITE NOT SPECIFIED  Final   Special Requests   Final    BOTTLES DRAWN AEROBIC AND ANAEROBIC Blood Culture adequate volume   Culture   Final    NO GROWTH 3 DAYS Performed at [facility], [address] [place], [place] [address]    Report Status PENDING  Incomplete     Labs: BNP (last 3 results) No  results for input(s): BNP in the last 8760 hours. Basic Metabolic Panel: Recent Labs  Lab 12/25/17 0349 12/26/17 0407 12/26/17 1119 12/27/17 0420 12/28/17 0236 12/30/17 0554 12/31/17 0429 12/31/17 1715  NA 149*  --  144 142 137 134* 137  --   K 3.4*  --  2.7* 3.3* 3.5 3.2* 3.0*  --   CL 113*  --  107 107 101 97* 99  --   CO2 22  --  26 27 24 28 28   --   GLUCOSE 114*  --  119* 103* 102* 119* 121*  --   BUN 9  --  6 9 7  <5* <5*  --   CREATININE 0.80  --  0.47* 0.49* 0.56*  0.53* 0.55*  --   CALCIUM 9.3  --  8.6* 8.8* 9.5 8.8* 8.9  --   MG 2.0 1.8  --  1.8 2.0 1.8  --  2.0  PHOS 2.3* 2.9  --  3.1 2.9 3.4  --   --    Liver Function Tests: Recent Labs  Lab 12/28/17 1617  AST 135*  ALT 140*  ALKPHOS 114  BILITOT 1.3*  PROT 6.6  ALBUMIN 3.1*   Recent Labs  Lab 12/24/17 1829 12/30/17 0554  LIPASE 30 34  AMYLASE  --  43   No results for input(s): AMMONIA in the last 168 hours. CBC: Recent Labs  Lab 12/27/17 0420 12/28/17 0236 12/30/17 0554 12/31/17 0429 12/31/17 1022  WBC 5.0 8.4 8.5 3.8* 4.6  HGB 11.4* 13.6 12.0* 12.2* 13.2  HCT 35.1* 40.9 36.4* 37.0* 39.6  MCV 87.1 85.2 85.0 86.7 86.3  PLT 148* 253 228 247 288   Cardiac Enzymes: Recent Labs  Lab 12/24/17 1829 12/25/17 0023 12/25/17 0349  CKTOTAL 794*  --   --   CKMB 1.3  --   --   TROPONINI <0.03 <0.03 <0.03   BNP: Invalid input(s): POCBNP CBG: Recent Labs  Lab 12/28/17 1607 12/28/17 2022 12/29/17 0013 12/29/17 0418 12/29/17 0714  GLUCAP 100* 84 86 84 94   D-Dimer No results for input(s): DDIMER in the last 72 hours. Hgb A1c No results for input(s): HGBA1C in the last 72 hours. Lipid Profile No results for input(s): CHOL, HDL, LDLCALC, TRIG, CHOLHDL, LDLDIRECT in the last 72 hours. Thyroid function studies No results for input(s): TSH, T4TOTAL, T3FREE, THYROIDAB in the last 72 hours.  Invalid input(s): FREET3 Anemia work up No results for input(s): VITAMINB12, FOLATE, FERRITIN, TIBC, IRON, RETICCTPCT in the last 72 hours. Urinalysis    Component Value Date/Time   COLORURINE YELLOW 12/25/2017 0339   APPEARANCEUR TURBID (A) 12/25/2017 0339   LABSPEC 1.028 12/25/2017 0339   PHURINE 6.0 12/25/2017 0339   GLUCOSEU NEGATIVE 12/25/2017 0339   HGBUR NEGATIVE 12/25/2017 0339   BILIRUBINUR NEGATIVE 12/25/2017 0339   KETONESUR 80 (A) 12/25/2017 0339   PROTEINUR NEGATIVE 12/25/2017 0339   NITRITE NEGATIVE 12/25/2017 0339   LEUKOCYTESUR NEGATIVE 12/25/2017 0339    Sepsis Labs Invalid input(s): PROCALCITONIN,  WBC,  LACTICIDVEN Microbiology Recent Results (from the past 240 hour(s))  Blood Culture (routine x 2)     Status: None   Collection Time: 12/24/17 12:30 PM  Result Value Ref Range Status   Specimen Description BLOOD RIGHT ANTECUBITAL  Final   Special Requests   Final    BOTTLES DRAWN AEROBIC AND ANAEROBIC Blood Culture adequate volume   Culture   Final    NO GROWTH 5 DAYS Performed at [facility], [address] [place], [place] [address]    Report Status 12/29/2017 FINAL  Final  Blood Culture (routine x 2)     Status: None   Collection Time: 12/24/17 12:32 PM  Result Value Ref Range Status   Specimen Description BLOOD LEFT ANTECUBITAL  Final   Special Requests   Final    BOTTLES DRAWN AEROBIC AND ANAEROBIC Blood Culture adequate volume   Culture   Final    NO GROWTH 5 DAYS Performed at [facility], [address] [place], [place] [address]    Report Status 12/29/2017 FINAL  Final  Respiratory Panel by PCR     Status: None   Collection Time: 12/24/17  2:58 PM  Result Value Ref Range Status   Adenovirus NOT DETECTED NOT DETECTED Final   Coronavirus 229E NOT DETECTED NOT DETECTED Final   Coronavirus HKU1 NOT DETECTED NOT DETECTED Final   Coronavirus NL63 NOT DETECTED NOT DETECTED Final   Coronavirus OC43 NOT DETECTED NOT DETECTED Final   Metapneumovirus NOT DETECTED NOT DETECTED Final   Rhinovirus / Enterovirus NOT DETECTED NOT DETECTED Final   Influenza A NOT DETECTED NOT DETECTED Final   Influenza B NOT DETECTED NOT DETECTED Final   Parainfluenza Virus 1 NOT DETECTED NOT DETECTED Final   Parainfluenza Virus 2 NOT DETECTED NOT DETECTED Final   Parainfluenza Virus 3 NOT DETECTED NOT DETECTED Final   Parainfluenza Virus 4 NOT DETECTED NOT DETECTED Final   Respiratory Syncytial Virus NOT DETECTED NOT DETECTED Final   Bordetella pertussis NOT DETECTED NOT DETECTED Final   Chlamydophila pneumoniae NOT DETECTED  NOT DETECTED Final   Mycoplasma pneumoniae NOT DETECTED NOT DETECTED Final    Comment: Performed at [facility], [address] [place], [place] [address]  CSF culture     Status: None   Collection Time: 12/24/17  4:07 PM  Result Value Ref Range Status   Specimen Description CSF  Final   Special Requests Normal  Final   Gram Stain   Final    WBC PRESENT, PREDOMINANTLY MONONUCLEAR NO ORGANISMS SEEN CYTOSPIN SMEAR    Culture   Final    NO GROWTH 3 DAYS Performed at [facility], [address] [place], [place] [address]    Report Status 12/27/2017 FINAL  Final  MRSA PCR Screening     Status: None   Collection Time: 12/24/17  6:29 PM  Result Value Ref Range Status   MRSA by PCR NEGATIVE NEGATIVE Final    Comment:        The GeneXpert MRSA Assay (FDA approved for NASAL specimens only), is one component of a comprehensive MRSA colonization surveillance program. It is not intended to diagnose MRSA infection nor to guide or monitor treatment for MRSA infections. Performed at [facility], [address], [place], [place] [address]   Urine culture     Status: None   Collection Time: 12/25/17  3:38 AM  Result Value Ref Range Status   Specimen Description URINE, CATHETERIZED  Final   Special Requests Normal  Final   Culture   Final    NO GROWTH Performed at [facility], [address], [place], [place] [address]    Report Status 12/26/2017 FINAL  Final  Culture, blood (routine x 2)     Status: None (Preliminary result)   Collection Time: 12/28/17  1:23 PM  Result Value Ref Range Status   Specimen Description BLOOD SITE NOT SPECIFIED  Final   Special  Requests   Final    BOTTLES DRAWN AEROBIC AND ANAEROBIC Blood Culture adequate volume   Culture   Final    NO GROWTH 3 DAYS Performed at [facility], [address] [place], [place] [address]    Report Status PENDING  Incomplete  Culture, blood (routine x 2)     Status: None (Preliminary  result)   Collection Time: 12/28/17  1:23 PM  Result Value Ref Range Status   Specimen Description BLOOD SITE NOT SPECIFIED  Final   Special Requests   Final    BOTTLES DRAWN AEROBIC AND ANAEROBIC Blood Culture adequate volume   Culture   Final    NO GROWTH 3 DAYS Performed at [facility], [address] [place], [place] [address]    Report Status PENDING  Incomplete     Time coordinating discharge in minutes: 60  SIGNED:   Calvert CantorSaima Gabi Mcfate, MD  [facility] 12/31/2017, 6:25 PM Pager   If 7PM-7AM, please contact night-coverage [email or web address] Password [email or web address]

## 2017-12-31 NOTE — Progress Notes (Signed)
Patient was able to stand with standby assist from this RN and his mother. Patient followed directions and cues appropriately. MD made aware. Dondra SpryMoore, Brionne Mertz Islee, RN

## 2017-12-31 NOTE — Consult Note (Signed)
WOC Nurse wound consult note Reason for Consult: RUE wound Patient is normally functional autistic 22 yo. Today he is mostly nonverbal with tremors. Mom of patient reports that the BP cuff was in place and that his constant motions caused injury to the right inner forearm.  Wound type: trauma  Pressure Injury POA: NA Measurement: 1cm x 1cm x 0.1cm  Wound QMV:HQIObed:pink, and clean Drainage (amount, consistency, odor) scant, no odor Periwound: intact  Dressing procedure/placement/frequency: Add antibiotic ointment, cover with dry dressing. Change daily.   Discussed POC with patient and bedside nurse.  Re consult if needed, will not follow at this time. Thanks  Telissa Palmisano M.D.[employer] MSN, RN,CWOCN, CNS, CWON-AP [phone])

## 2017-12-31 NOTE — Progress Notes (Signed)
Subjective: Some improvement in tremor, but still prominent.   Exam: Vitals:   12/31/17 0545 12/31/17 0915  BP: 96/61 (!) 163/85  Pulse: 96 (!) 108  Resp: 17 19  Temp: 98.4 F (36.9 C) (!) 97.3 F (36.3 C)  SpO2: 96%    Gen: In bed, NAD Resp: non-laboured breathing.  Abd: soft, nt  Sedated Neuro: MS: Eyes open,  answers simple questions.  ON:GEXBMCN:PERRL, face symmetric Motor: coarse tremor present bilaterally, slightly lower amplitude than previous days.  Sensory:as above.   Pertinent Labs: CSF 12/23  WBC 1 RBC 1 Protein 19 Glucose 87  Mg 12/26 1.8 Na 142 Cr 0.49  No leukocytosis  Impression: 22 yo M with autisim and seizure disorder who presented with breakthrough seizure preseting as status epilepticus. His tremors have been subsequently found to not have EEG correlate, but I am not sure if the initial event was the same tremor and given a different description, as well as a neurologist's observance of the event and conclusion that it was ictal, I think that continuing trileptal is prudent. It may have some benefit from a mood stabilization stand point as well.   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 This has had some benefit from clonazepam, but still present. It appears to be gradually improving and I would favor continuing present management and following up with outpatient neurology.   Recommendations: 1) Hold adderall 2) continue clonopin 0.5mg  BID 3) continue trileptal 300mg  BID until 1/4, then 600mg  BID 4) risperdal per psychiatry.  5) could increase clonopin if tolerated in a week or so if tremor is still prominant.  6) will need outpatient follow up with psych and neuro 7) Please call with further questions or concerns.   Ritta SlotMcNeill Karter Haire, MD [facility] [phone]  If 7pm- 7am, please page neurology on call as listed in  AMION.

## 2017-12-31 NOTE — Progress Notes (Signed)
  Speech Language Pathology Patient Details Name: Clifford BenderDavid Mosley MRN: [id number] DOB: [birth year] Today's Date: 12/31/2017 Time:  -     Order received for possible diet upgrade. Unfortunately ST is unable to see pt today and will see 12/31             GO                Royce MacadamiaLitaker, Jameire Kouba Willis 12/31/2017, 2:38 PM   Breck CoonsLisa Willis Lonell FaceLitaker M.Ed Nurse, children'sCCC-SLP Speech-Language Pathologist Pager [phone] Office [phone]

## 2017-12-31 NOTE — Evaluation (Addendum)
Physical Therapy Evaluation Patient Details Name: Clifford BenderDavid Mosley MRN: [id number] DOB: 1995/06/25 Today's Date: 12/31/2017   History of Present Illness  Pt admitted with breakthrough seizure and possible sepsis. Intubated 12/23 - 12/26. PMH significant for autism.  Clinical Impression  Pt admitted with above diagnosis. Pt currently with functional limitations due to the deficits listed below (see PT Problem List). At the time of PT eval pt was able to perform transfers with up to +2 max assist for balance support and safety. We were not able to attempt ambulation this session due to weakness and abdominal pain. Tolerance for functional activity is decreased at this time however feel this patient would be a good candidate for CIR level rehab to maximize functional independence and return to PLOF. Mom present and reports that at baseline pt is ambulatory, and enjoys riding his bike and playing basketball. It appears that family lives in [place] but the group home pt was in is here in [place]. Acutely, pt will benefit from skilled PT to increase their independence and safety with mobility to allow discharge to the venue listed below.       Follow Up Recommendations CIR;Supervision/Assistance - 24 hour    Equipment Recommendations  Other (comment)(TBD by next venue of care)    Recommendations for Other Services Rehab consult     Precautions / Restrictions Precautions Precautions: Fall Precaution Comments: Autistic; "22 year old level" per mom Restrictions Weight Bearing Restrictions: No      Mobility  Bed Mobility Overal bed mobility: Needs Assistance Bed Mobility: Supine to Sit;Sit to Supine     Supine to sit: Mod assist;+2 for physical assistance Sit to supine: Mod assist;+2 for physical assistance   General bed mobility comments: Pt was able to transition to EOB with hand-over-hand assist to reach for rails, and for trunk elevation to full sitting position. Pt initiated LE's off  EOB without assist. When returning to supine, +2 assist required for controlled trunk lower and LE elevation back into bed.   Transfers Overall transfer level: Needs assistance Equipment used: 2 person hand held assist Transfers: Sit to/from Stand Sit to Stand: Max assist;+2 physical assistance         General transfer comment: Pt required +2 assist to power-up to full stand. Pt attempting to hold on to therapist's arm for support but unable to hold grasp. We stood a total of 3 times for ~10-15 seconds each attempt.   Ambulation/Gait             General Gait Details: Unable at this time.   Stairs            Wheelchair Mobility    Modified Rankin (Stroke Patients Only)       Balance Overall balance assessment: Needs assistance Sitting-balance support: Feet supported;Bilateral upper extremity supported Sitting balance-Leahy Scale: Zero Sitting balance - Comments: At times requiring +2 assist to maintain upright trunk. As pt fatigued, increased assist was required.  Postural control: Right lateral lean;Posterior lean;Other (comment)(Anterior lean) Standing balance support: Bilateral upper extremity supported;During functional activity Standing balance-Leahy Scale: Zero Standing balance comment: +2 assist required.                              Pertinent Vitals/Pain Pain Assessment: Faces Faces Pain Scale: Hurts even more Pain Location: Abdomen. Mom reports he has been having "gas pains" Pain Descriptors / Indicators: Grimacing;Guarding;Moaning Pain Intervention(s): Limited activity within patient's tolerance;Monitored during session;Repositioned  Home Living Family/patient expects to be discharged to:: Private residence Living Arrangements: Group Home Available Help at Discharge: Family Type of Home: House                Prior Function Level of Independence: Independent         Comments: Mom reports at baseline, pt ambulates and enjoys  riding a bike and playing basketball.      Hand Dominance        Extremity/Trunk Assessment   Upper Extremity Assessment Upper Extremity Assessment: Defer to OT evaluation    Lower Extremity Assessment Lower Extremity Assessment: Generalized weakness    Cervical / Trunk Assessment Cervical / Trunk Assessment: Other exceptions Cervical / Trunk Exceptions: Forward head posture with rounded shoulders  Communication   Communication: Expressive difficulties  Cognition Arousal/Alertness: Awake/alert Behavior During Therapy: Flat affect;Impulsive Overall Cognitive Status: History of cognitive impairments - at baseline                                        General Comments      Exercises     Assessment/Plan    PT Assessment Patient needs continued PT services  PT Problem List Decreased strength;Decreased activity tolerance;Decreased balance;Decreased mobility;Decreased safety awareness;Decreased knowledge of use of DME;Decreased knowledge of precautions;Cardiopulmonary status limiting activity;Pain;Decreased cognition       PT Treatment Interventions DME instruction;Gait training;Stair training;Functional mobility training;Therapeutic activities;Therapeutic exercise;Neuromuscular re-education;Patient/family education    PT Goals (Current goals can be found in the Care Plan section)  Acute Rehab PT Goals Patient Stated Goal: Pt did not state goals PT Goal Formulation: With family Time For Goal Achievement: 01/14/18 Potential to Achieve Goals: Good    Frequency Min 3X/week   Barriers to discharge        Co-evaluation               AM-PAC PT "6 Clicks" Mobility  Outcome Measure Help needed turning from your back to your side while in a flat bed without using bedrails?: A Little Help needed moving from lying on your back to sitting on the side of a flat bed without using bedrails?: A Lot Help needed moving to and from a bed to a chair  (including a wheelchair)?: Total Help needed standing up from a chair using your arms (e.g., wheelchair or bedside chair)?: A Lot Help needed to walk in hospital room?: Total Help needed climbing 3-5 steps with a railing? : Total 6 Click Score: 10    End of Session Equipment Utilized During Treatment: Gait belt Activity Tolerance: Patient limited by fatigue Patient left: in bed;with call bell/phone within reach;with bed alarm set;with family/visitor present Nurse Communication: Mobility status PT Visit Diagnosis: Unsteadiness on feet (R26.81);Pain;Muscle weakness (generalized) (M62.81);Difficulty in walking, not elsewhere classified (R26.2) Pain - part of body: (Abdomen)    Time: [id number] PT Time Calculation (min) (ACUTE ONLY): 18 min   Charges:   PT Evaluation $PT Eval High Complexity: 1 High          Conni SlipperLaura Aleese Kamps, PT, DPT Acute Rehabilitation Services Pager: [phone] Office: [phone]   Marylynn PearsonLaura D Laniece Hornbaker 12/31/2017, 1:29 PM

## 2017-12-31 NOTE — Progress Notes (Addendum)
Rehab Admissions Coordinator Note:  Patient was screened by Clois DupesBoyette, Amie Cowens Godwin for appropriateness for an Inpatient Acute Rehab Consult per PT recommendations.  At this time, we are recommending Inpatient Rehab consult. Please place order for consult if Mom would like him considered for admit. Noted Mom asking for SNF IN [place] area.  Clois DupesBoyette, Ti[place]ope Emmitt Godwin RN MSN 12/31/2017, 2:48 PM  I can be reached at [phone].

## 2018-01-02 LAB — CULTURE, BLOOD (ROUTINE X 2)
Culture: NO GROWTH
Culture: NO GROWTH
Special Requests: ADEQUATE
Special Requests: ADEQUATE

## 2018-09-03 HISTORY — PX: CHOLECYSTECTOMY: SHX55

## 2018-12-16 ENCOUNTER — Ambulatory Visit: Payer: Medicaid Other | Admitting: Diagnostic Neuroimaging

## 2018-12-16 ENCOUNTER — Encounter: Payer: Self-pay | Admitting: Diagnostic Neuroimaging

## 2018-12-16 ENCOUNTER — Telehealth: Payer: Self-pay | Admitting: *Deleted

## 2018-12-16 ENCOUNTER — Encounter: Payer: Self-pay | Admitting: *Deleted

## 2018-12-16 NOTE — Telephone Encounter (Signed)
Patient was no show for new patient appointment today. 

## 2019-02-06 ENCOUNTER — Other Ambulatory Visit: Payer: Self-pay

## 2019-02-06 ENCOUNTER — Encounter (HOSPITAL_COMMUNITY): Payer: Self-pay | Admitting: Emergency Medicine

## 2019-02-06 ENCOUNTER — Ambulatory Visit (HOSPITAL_COMMUNITY)
Admission: EM | Admit: 2019-02-06 | Discharge: 2019-02-06 | Disposition: A | Discharge status: Home / Self Care | Payer: Medicaid Other | Attending: Family Medicine | Admitting: Family Medicine

## 2019-02-06 DIAGNOSIS — F840 Autistic disorder: Secondary | ICD-10-CM | POA: Insufficient documentation

## 2019-02-06 DIAGNOSIS — R509 Fever, unspecified: Secondary | ICD-10-CM

## 2019-02-06 DIAGNOSIS — Z20822 Contact with and (suspected) exposure to covid-19: Secondary | ICD-10-CM | POA: Insufficient documentation

## 2019-02-06 DIAGNOSIS — R059 Cough, unspecified: Secondary | ICD-10-CM

## 2019-02-06 DIAGNOSIS — R05 Cough: Secondary | ICD-10-CM

## 2019-02-06 LAB — POC SARS CORONAVIRUS 2 AG: SARS Coronavirus 2 Ag: NEGATIVE

## 2019-02-06 LAB — POC SARS CORONAVIRUS 2 AG -  ED: SARS Coronavirus 2 Ag: NEGATIVE

## 2019-02-06 MED ORDER — ACETAMINOPHEN 325 MG PO TABS
ORAL_TABLET | ORAL | Status: AC
  Filled 2019-02-06: qty 3

## 2019-02-06 MED ORDER — HYDROCODONE-HOMATROPINE 5-1.5 MG/5ML PO SYRP: 5.0000 mL | ORAL_SOLUTION | Freq: Four times a day (QID) | ORAL | 0 refills | Status: AC | PRN

## 2019-02-06 MED ORDER — ACETAMINOPHEN 325 MG PO TABS
975.0000 mg | ORAL_TABLET | Freq: Once | ORAL | Status: AC
  Administered 2019-02-06: 975 mg via ORAL

## 2019-02-06 NOTE — ED Provider Notes (Signed)
[facility]   [id number] 02/06/19 Arrival Time: 1708  ASSESSMENT & PLAN:  1. Cough   2. Fever and chills     See AVS for discharge instructions. No signs of bacterial infection. Caregiver declines CXR tonight. Rapid COVID testing negative. PCR testing sent. Recommend Tylenol/Advil regularly.  Meds ordered this encounter  Medications  . HYDROcodone-homatropine (HYCODAN) 5-1.5 MG/5ML syrup    Sig: Take 5 mLs by mouth every 6 (six) hours as needed for cough.    Dispense:  120 mL    Refill:  0  . acetaminophen (TYLENOL) tablet 975 mg    Cough medication sedation precautions. Discussed typical duration of viral illnesses. OTC symptom care as needed. Ensure adequate fluid intake and rest. May f/u with PCP or here as needed.  Reviewed expectations re: course of current medical issues. Questions answered. Outlined signs and symptoms indicating need for more acute intervention. Patient verbalized understanding. After Visit Summary given.   SUBJECTIVE: History from: caregivers.  Clifford Mosley is a 24 y.o. male whose caregivers reports he has not been feeling well. Abrupt onset today. Temperature of 101 degrees F with an active wet-sounding cough. Associated headache. Normal PO intake without n/v/d. No SOB reported. Known sick contacts or COVID-19 exposure: none. Lives in a group home. No specific or significant aggravating or alleviating factors reported. Ambulatory. No rashes reported. OTC treatment: none reported..   Social History   Tobacco Use  Smoking Status Never Smoker  Smokeless Tobacco Never Used      OBJECTIVE:  Vitals:   02/06/19 1854  BP: 111/71  Pulse: 91  Resp: 18  Temp: 98.4 F (36.9 C)  SpO2: 99%     General appearance: alert; appears fatigued HEENT: nasal congestion; clear runny nose; throat irritation/cobblestoning; visualized portions of TMs appear normal; cerumen buildup bilaterally Neck: supple without LAD CV: RRR Lungs:  unlabored respirations, symmetrical air entry without wheezing; breath sounds just slightly decreased on the right but caregiver reports this is his baseline/has been told this before; cough: moderate Abd: soft Ext: no LE edema Skin: warm and dry Psychological: alert and cooperative; normal mood and affect    No Known Allergies  Past Medical History:  Diagnosis Date  . Autism   . Cognitive deficits   . GERD (gastroesophageal reflux disease)   . Occasional tremors   . Seizure disorder [facility])      Social History   Socioeconomic History  . Marital status: Single    Spouse name: Not on file  . Number of children: Not on file  . Years of education: Not on file  . Highest education level: Not on file  Occupational History  . Not on file  Tobacco Use  . Smoking status: Never Smoker  . Smokeless tobacco: Never Used  Substance and Sexual Activity  . Alcohol use: Never  . Drug use: Never  . Sexual activity: Never  Other Topics Concern  . Not on file  Social History Narrative  . Not on file   Social Determinants of Health   Financial Resource Strain:   . Difficulty of Paying Living Expenses: Not on file  Food Insecurity:   . Worried About Programme researcher, broadcasting/film/video in the Last Year: Not on file  . Ran Out of Food in the Last Year: Not on file  Transportation Needs:   . Lack of Transportation (Medical): Not on file  . Lack of Transportation (Non-Medical): Not on file  Physical Activity:   . Days of Exercise per Week:  Not on file  . Minutes of Exercise per Session: Not on file  Stress:   . Feeling of Stress : Not on file  Social Connections:   . Frequency of Communication with Friends and Family: Not on file  . Frequency of Social Gatherings with Friends and Family: Not on file  . Attends Religious Services: Not on file  . Active Member of Clubs or Organizations: Not on file  . Attends Archivist Meetings: Not on file  . Marital Status: Not on file  Intimate  Partner Violence:   . Fear of Current or Ex-Partner: Not on file  . Emotionally Abused: Not on file  . Physically Abused: Not on file  . Sexually Abused: Not on file           Vanessa Kick, MD 02/06/19 1946

## 2019-02-06 NOTE — Discharge Instructions (Addendum)
You have been tested for COVID-19 today. If your test returns positive, you will receive a phone call from [facility] regarding your results. Negative test results are not called. Both positive and negative results area always visible on MyChart. If you do not have a MyChart account, sign up instructions are provided in your discharge papers. Please do not hesitate to contact us should you have questions or concerns.  Be aware, your cough medication may cause drowsiness.  Follow up with your primary care doctor or here if you are not seeing improvement of your symptoms over the next several days, sooner if you feel you are worsening.  Caring for yourself: Get plenty of rest. Drink plenty of fluids, enough so that your urine is light yellow or clear like water. If you have kidney, heart, or liver disease and have to limit fluids, talk with your doctor before you increase the amount of fluids you drink. Take an over-the-counter pain medicine if needed, such as acetaminophen (Tylenol), ibuprofen (Advil, Motrin), or naproxen (Aleve), to relieve fever, headache, and muscle aches. Read and follow all instructions on the label. No one younger than 20 should take aspirin. It has been linked to Reye syndrome, a serious illness. Before you use over the counter cough and cold medicines, check the label. These medicines may not be safe for children younger than age 40 or for people with certain health problems. If the skin around your nose and lips becomes sore, put some petroleum jelly on the area.  Avoid spreading the flu: Wash your hands regularly, and keep your hands away from your face.  Stay home from school, work, and other public places until you are feeling better and any fever has been gone for at least 24 hours. The fever needs to have gone away on its own without the help of medicine.

## 2019-02-06 NOTE — ED Triage Notes (Addendum)
Pt here for fever and cough; pt lives in group home; per staff other residents with same sx; group home reports fever of 101 tonight

## 2019-02-08 LAB — NOVEL CORONAVIRUS, NAA (HOSP ORDER, SEND-OUT TO REF LAB; TAT 18-24 HRS): SARS-CoV-2, NAA: NOT DETECTED

## 2019-05-01 IMAGING — DX DG CHEST 1V PORT
1 series · 1 of 1 positions shown · non-contrast
Comparison: 12/25/2017

CLINICAL DATA: Acute respiratory failure

EXAM:
PORTABLE CHEST 1 VIEW

[chest]
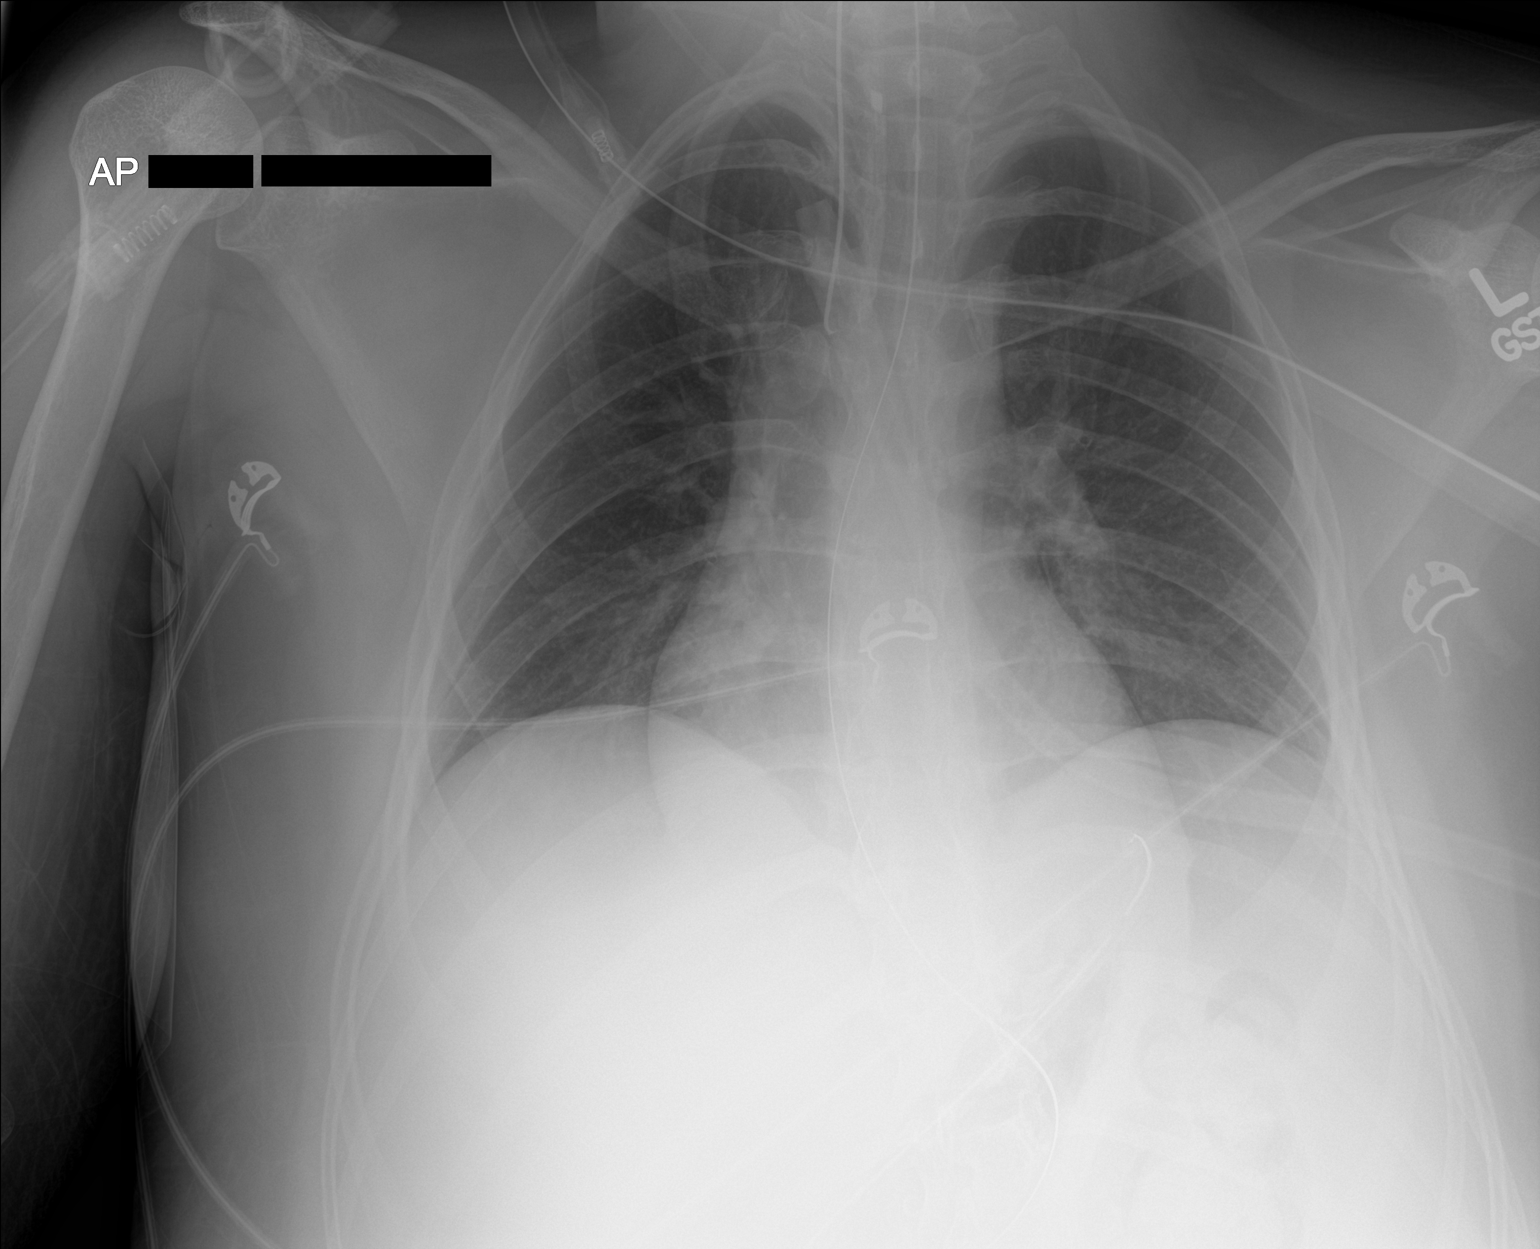

[1 of 1 positions shown; findings below may reference images not displayed]

FINDINGS: Endotracheal tube 2.5 cm above the carina.  NG tube in the stomach.

Lungs remain clear without infiltrate effusion or edema.
IMPRESSION: Endotracheal tube in good position.  Lungs remain clear.

## 2019-09-26 ENCOUNTER — Other Ambulatory Visit: Payer: Self-pay

## 2019-09-26 ENCOUNTER — Encounter (HOSPITAL_COMMUNITY): Payer: Self-pay

## 2019-09-26 ENCOUNTER — Ambulatory Visit (HOSPITAL_COMMUNITY)
Admission: EM | Admit: 2019-09-26 | Discharge: 2019-09-26 | Disposition: A | Discharge status: Home / Self Care | Payer: Medicaid Other | Attending: Urgent Care | Admitting: Urgent Care

## 2019-09-26 DIAGNOSIS — Z20822 Contact with and (suspected) exposure to covid-19: Secondary | ICD-10-CM | POA: Diagnosis not present

## 2019-09-26 DIAGNOSIS — Z9049 Acquired absence of other specified parts of digestive tract: Secondary | ICD-10-CM | POA: Insufficient documentation

## 2019-09-26 DIAGNOSIS — R112 Nausea with vomiting, unspecified: Secondary | ICD-10-CM

## 2019-09-26 DIAGNOSIS — F840 Autistic disorder: Secondary | ICD-10-CM | POA: Diagnosis not present

## 2019-09-26 DIAGNOSIS — R1084 Generalized abdominal pain: Secondary | ICD-10-CM | POA: Diagnosis not present

## 2019-09-26 DIAGNOSIS — Z79899 Other long term (current) drug therapy: Secondary | ICD-10-CM | POA: Insufficient documentation

## 2019-09-26 DIAGNOSIS — K30 Functional dyspepsia: Secondary | ICD-10-CM

## 2019-09-26 DIAGNOSIS — G40909 Epilepsy, unspecified, not intractable, without status epilepticus: Secondary | ICD-10-CM | POA: Insufficient documentation

## 2019-09-26 MED ORDER — ONDANSETRON HCL 4 MG/2ML IJ SOLN
4.0000 mg | Freq: Once | INTRAMUSCULAR | Status: AC
  Administered 2019-09-26: 4 mg via INTRAMUSCULAR

## 2019-09-26 MED ORDER — ONDANSETRON HCL 4 MG/2ML IJ SOLN
INTRAMUSCULAR | Status: AC
  Filled 2019-09-26: qty 2

## 2019-09-26 MED ORDER — ONDANSETRON HCL 4 MG/5ML PO SOLN: 8.0000 mg | Freq: Three times a day (TID) | ORAL | 0 refills | Status: AC | PRN

## 2019-09-26 NOTE — ED Provider Notes (Signed)
[facility]   MRN: [id number] DOB: 1995/08/24  Subjective:   Clifford Mosley is a 24 y.o. male presenting for acute onset this morning of upset stomach. Had profuse vomiting, multiple episodes. Initially vomitus was yellow but changed to green. Has had decreased appetite, has not drank much fluids. Has pmh of seizures.   No current facility-administered medications for this encounter.  Current Outpatient Medications:  .  clonazePAM (KLONOPIN) 0.5 MG disintegrating tablet, Take 1 tablet (0.5 mg total) by mouth 2 (two) times daily., Disp: 60 tablet, Rfl: 0 .  HYDROcodone-homatropine (HYCODAN) 5-1.5 MG/5ML syrup, Take 5 mLs by mouth every 6 (six) hours as needed for cough., Disp: 120 mL, Rfl: 0 .  Oxcarbazepine (TRILEPTAL) 300 MG tablet, Take 1 tablet (300 mg total) by mouth 2 (two) times daily., Disp: 60 tablet, Rfl: 0 .  risperiDONE (RISPERDAL M-TABS) 2 MG disintegrating tablet, Take 1 tablet (2 mg total) by mouth 2 (two) times daily., Disp: 60 tablet, Rfl: 0   Allergies  Allergen Reactions  . Codeine     Past Medical History:  Diagnosis Date  . Autism   . Cognitive deficits   . GERD (gastroesophageal reflux disease)   . Occasional tremors   . Seizure disorder [facility])      Past Surgical History:  Procedure Laterality Date  . CHOLECYSTECTOMY  09/2018  . no known allergies      Family History  Family history unknown: Yes    Social History   Tobacco Use  . Smoking status: Never Smoker  . Smokeless tobacco: Never Used  Vaping Use  . Vaping Use: Never used  Substance Use Topics  . Alcohol use: Never  . Drug use: Never    ROS   Objective:   Vitals: BP (!) 93/50 (BP Location: Right Arm)   Pulse 83   Temp 99.2 F (37.3 C) (Oral)   Resp 20   SpO2 95%   BP recheck was 112/61 on recheck.   Wt Readings from Last 3 Encounters:  12/30/17 185 lb 13.6 oz (84.3 kg)   Temp Readings from Last 3 Encounters:  09/26/19 99.2 F (37.3 C) (Oral)  02/06/19  98.4 F (36.9 C)  12/31/17 97.7 F (36.5 C) (Oral)   BP Readings from Last 3 Encounters:  09/26/19 (!) 93/50  02/06/19 111/71  12/31/17 (!) 137/103   Pulse Readings from Last 3 Encounters:  09/26/19 83  02/06/19 91  12/31/17 (!) 105   Physical Exam Constitutional:      General: He is not in acute distress.    Appearance: Normal appearance. He is well-developed. He is not ill-appearing, toxic-appearing or diaphoretic.  HENT:     Head: Normocephalic and atraumatic.     Right Ear: External ear normal.     Left Ear: External ear normal.     Nose: Nose normal.     Mouth/Throat:     Mouth: Mucous membranes are moist.     Pharynx: Oropharynx is clear.  Eyes:     General: No scleral icterus.       Right eye: No discharge.        Left eye: No discharge.     Extraocular Movements: Extraocular movements intact.     Conjunctiva/sclera: Conjunctivae normal.     Pupils: Pupils are equal, round, and reactive to light.  Cardiovascular:     Rate and Rhythm: Normal rate and regular rhythm.     Heart sounds: Normal heart sounds. No murmur heard.  No  friction rub. No gallop.   Pulmonary:     Effort: Pulmonary effort is normal. No respiratory distress.     Breath sounds: Normal breath sounds. No stridor. No wheezing, rhonchi or rales.  Abdominal:     General: Bowel sounds are normal. There is no distension.     Palpations: Abdomen is soft. There is no mass.     Tenderness: There is abdominal tenderness (generalized, mild over right side, epigastric area). There is no guarding or rebound.  Skin:    General: Skin is warm and dry.  Neurological:     Mental Status: He is alert and oriented to person, place, and time.  Psychiatric:        Mood and Affect: Mood normal.        Behavior: Behavior normal.        Thought Content: Thought content normal.        Judgment: Judgment normal.    IM Zofran given in clinic.  Assessment and Plan :   PDMP not reviewed this encounter.  1. Nausea  and vomiting, intractability of vomiting not specified, unspecified vomiting type   2. Upset stomach   3. Generalized abdominal pain     Suspect patient is at the start of a gastroenteritis.  Recommend supportive care, light meals, Zofran.  COVID-19 testing is pending. Counseled patient on potential for adverse effects with medications prescribed/recommended today, ER and return-to-clinic precautions discussed, patient verbalized understanding.    Wallis Bamberg, [place] 09/26/19 1853

## 2019-09-26 NOTE — ED Triage Notes (Signed)
Pt presents after vomiting today.

## 2019-09-26 NOTE — Discharge Instructions (Addendum)
Make sure you push fluids drinking mostly water but mix it with Gatorade.  Try to eat light meals including soups, broths and soft foods, fruits.  You may use Zofran for your nausea and vomiting once every 8 hours.   Please return to the clinic if symptoms worsen or you start having severe abdominal pain not helped by taking Tylenol or start having bloody stools or blood in the vomit.  

## 2019-09-27 LAB — SARS CORONAVIRUS 2 (TAT 6-24 HRS): SARS Coronavirus 2: NEGATIVE

## 2021-06-06 ENCOUNTER — Ambulatory Visit: Payer: Medicaid Other | Admitting: Podiatry

## 2021-08-24 ENCOUNTER — Ambulatory Visit (INDEPENDENT_AMBULATORY_CARE_PROVIDER_SITE_OTHER): Payer: Medicaid Other | Admitting: Podiatry

## 2021-08-24 DIAGNOSIS — Q828 Other specified congenital malformations of skin: Secondary | ICD-10-CM

## 2021-08-24 NOTE — Progress Notes (Signed)
  Subjective:  Patient ID: Clifford Mosley, male    DOB: March 16, 1995,  MRN: [id number]  Chief Complaint  Patient presents with   Callouses    CALLOUS ON BOTTOM OFD BILATERAL FEET ON BALL OF FOOT      26 y.o. male presents with the above complaint.  Patient presents with bilateral submetatarsal 2 porokeratosis pain on palpation and hurts with ambulation also with pressure.  He is not able to communicate as well.  He is with a here with his caretaker who complains about the callus.  He denies any other acute current pain scale 7 out of 10 hurts with ambulation worse with pressure.   Review of Systems: Negative except as noted in the HPI. Denies N/V/F/Ch.  Past Medical History:  Diagnosis Date   Autism    Cognitive deficits    GERD (gastroesophageal reflux disease)    Occasional tremors    Seizure disorder (HCC)     Current Outpatient Medications:    clonazePAM (KLONOPIN) 0.5 MG disintegrating tablet, Take 1 tablet (0.5 mg total) by mouth 2 (two) times daily., Disp: 60 tablet, Rfl: 0   HYDROcodone-homatropine (HYCODAN) 5-1.5 MG/5ML syrup, Take 5 mLs by mouth every 6 (six) hours as needed for cough., Disp: 120 mL, Rfl: 0   ondansetron (ZOFRAN) 4 MG/5ML solution, Take 10 mLs (8 mg total) by mouth every 8 (eight) hours as needed for nausea or vomiting., Disp: 100 mL, Rfl: 0   Oxcarbazepine (TRILEPTAL) 300 MG tablet, Take 1 tablet (300 mg total) by mouth 2 (two) times daily., Disp: 60 tablet, Rfl: 0   risperiDONE (RISPERDAL M-TABS) 2 MG disintegrating tablet, Take 1 tablet (2 mg total) by mouth 2 (two) times daily., Disp: 60 tablet, Rfl: 0  Social History   Tobacco Use  Smoking Status Never  Smokeless Tobacco Never    Allergies  Allergen Reactions   Haloperidol     Other reaction(s): Other (See Comments) Seizure   Codeine    Objective:  There were no vitals filed for this visit. There is no height or weight on file to calculate BMI. Constitutional Well developed. Well nourished.   Vascular Dorsalis pedis pulses palpable bilaterally. Posterior tibial pulses palpable bilaterally. Capillary refill normal to all digits.  No cyanosis or clubbing noted. Pedal hair growth normal.  Neurologic Normal speech. Oriented to person, place, and time. Epicritic sensation to light touch grossly present bilaterally.  Dermatologic Hyperkeratotic lesion with central nucleated core noted to the submetatarsal 2.  Mild pain on palpation.  Orthopedic: Normal joint ROM without pain or crepitus bilaterally. No visible deformities. No bony tenderness.   Radiographs: None Assessment:   1. Porokeratosis    Plan:  Patient was evaluated and treated and all questions answered.  Bilateral submetatarsal 2 porokeratosis -All questions and concerns were discussed with the patient in extensive detail given the amount of pain that he is experiencing benefit with debridement of the lesion using chisel blade and handle the lesion was debrided down to healthy striated tissue.  No complication noted no pinpoint bleeding noted. -I discussed shoe gear modification as well.  No follow-ups on file.

## 2022-04-27 ENCOUNTER — Emergency Department (HOSPITAL_COMMUNITY)
Admission: EM | Admit: 2022-04-27 | Discharge: 2022-04-28 | Disposition: A | Discharge status: Home / Self Care | Payer: Medicaid Other | Attending: Emergency Medicine | Admitting: Emergency Medicine

## 2022-04-27 ENCOUNTER — Emergency Department (HOSPITAL_COMMUNITY): Payer: Medicaid Other

## 2022-04-27 ENCOUNTER — Encounter (HOSPITAL_COMMUNITY): Payer: Self-pay

## 2022-04-27 ENCOUNTER — Other Ambulatory Visit: Payer: Self-pay

## 2022-04-27 DIAGNOSIS — F840 Autistic disorder: Secondary | ICD-10-CM | POA: Diagnosis not present

## 2022-04-27 DIAGNOSIS — R079 Chest pain, unspecified: Secondary | ICD-10-CM | POA: Diagnosis present

## 2022-04-27 DIAGNOSIS — R0789 Other chest pain: Secondary | ICD-10-CM

## 2022-04-27 DIAGNOSIS — K219 Gastro-esophageal reflux disease without esophagitis: Secondary | ICD-10-CM | POA: Diagnosis not present

## 2022-04-27 LAB — COMPREHENSIVE METABOLIC PANEL
ALT: 27 U/L (ref 0–44)
AST: 28 U/L (ref 15–41)
Albumin: 3.5 g/dL (ref 3.5–5.0)
Alkaline Phosphatase: 93 U/L (ref 38–126)
Anion gap: 11 (ref 5–15)
BUN: 8 mg/dL (ref 6–20)
CO2: 23 mmol/L (ref 22–32)
Calcium: 9.4 mg/dL (ref 8.9–10.3)
Chloride: 103 mmol/L (ref 98–111)
Creatinine, Ser: 0.69 mg/dL (ref 0.61–1.24)
GFR, Estimated: >60 mL/min (ref >60)
Glucose, Bld: 114 mg/dL — ABNORMAL HIGH (ref 70–99)
Potassium: 3.5 mmol/L (ref 3.5–5.1)
Sodium: 137 mmol/L (ref 135–145)
Total Bilirubin: 0.5 mg/dL (ref 0.3–1.2)
Total Protein: 6 g/dL — ABNORMAL LOW (ref 6.5–8.1)

## 2022-04-27 LAB — CBC WITH DIFFERENTIAL/PLATELET
Abs Immature Granulocytes: 0.01 10*3/uL (ref 0.00–0.07)
Basophils Absolute: 0.1 10*3/uL (ref 0.0–0.1)
Basophils Relative: 1 %
Eosinophils Absolute: 0.3 10*3/uL (ref 0.0–0.5)
Eosinophils Relative: 6 %
HCT: 38.9 % — ABNORMAL LOW (ref 39.0–52.0)
Hemoglobin: 13.9 g/dL (ref 13.0–17.0)
Immature Granulocytes: 0 %
Lymphocytes Relative: 43 %
Lymphs Abs: 2.1 10*3/uL (ref 0.7–4.0)
MCH: 29.3 pg (ref 26.0–34.0)
MCHC: 35.7 g/dL (ref 30.0–36.0)
MCV: 82.1 fL (ref 80.0–100.0)
Monocytes Absolute: 0.4 10*3/uL (ref 0.1–1.0)
Monocytes Relative: 8 %
Neutro Abs: 2.1 10*3/uL (ref 1.7–7.7)
Neutrophils Relative %: 42 %
Platelets: 154 10*3/uL (ref 150–400)
RBC: 4.74 MIL/uL (ref 4.22–5.81)
RDW: 12 % (ref 11.5–15.5)
WBC: 4.9 10*3/uL (ref 4.0–10.5)
nRBC: 0 % (ref 0.0–0.2)

## 2022-04-27 LAB — LIPASE, BLOOD: Lipase: 31 U/L (ref 11–51)

## 2022-04-27 LAB — TROPONIN I (HIGH SENSITIVITY)
Troponin I (High Sensitivity): <2 ng/L (ref <18)
Troponin I (High Sensitivity): <2 ng/L (ref <18)

## 2022-04-27 MED ORDER — FAMOTIDINE 20 MG PO TABS
20.0000 mg | ORAL_TABLET | Freq: Once | ORAL | Status: AC
  Administered 2022-04-27: 20 mg via ORAL
  Filled 2022-04-27: qty 1

## 2022-04-27 MED ORDER — ALUM & MAG HYDROXIDE-SIMETH 200-200-20 MG/5ML PO SUSP
30.0000 mL | Freq: Once | ORAL | Status: AC
  Administered 2022-04-27: 30 mL via ORAL
  Filled 2022-04-27: qty 30

## 2022-04-27 NOTE — ED Provider Notes (Signed)
Clifford Mosley   CSN: [id number] Arrival date & time: 04/27/22  1937     History  Chief Complaint  Patient presents with   Chest Pain    Clifford Mosley is a 27 y.o. male.  Patient is a 27 year old male with a past medical history of autism, cognitive delay and seizure disorder presenting to the emergency department with chest pain.  Patient is here with his parents who states that he lives at a group home and they called and told them that just before coming to the emergency department he fell to the ground holding his chest complaining of pain.  They state that he did not lose consciousness.  They were not given any additional history but were given no report of him having any vomiting.  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  She also reports has had a cholecystectomy.  The history is provided by a parent. The history is limited by a developmental delay.  Chest Pain      Home Medications Prior to Admission medications   Medication Sig Start Date End Date Taking? Authorizing Provider  alum & mag hydroxide-simeth (MAALOX MAX) 400-400-40 MG/5ML suspension Take 15 mLs by mouth every 6 (six) hours as needed for indigestion. 04/28/22  Yes Theresia Lo, Benetta Spar K, DO  cetirizine (ZYRTEC) 10 MG tablet Take 10 mg by mouth daily.   Yes [provider]  Docusate Sodium (COLACE PO) Take 1 tablet by mouth daily.   Yes [provider]  famotidine (PEPCID) 20 MG tablet Take 1 tablet (20 mg total) by mouth 2 (two) times daily. 04/28/22  Yes Theresia Lo, Benetta Spar K, DO  ferrous sulfate 220 (44 Fe) MG/5ML solution Take 220 mg by mouth daily.   Yes [provider]  fluticasone (FLONASE) 50 MCG/ACT nasal spray Place 1 spray into both nostrils daily.   Yes [provider]  hydrOXYzine (VISTARIL) 50 MG capsule Take 50 mg by mouth at  bedtime. 03/28/22  Yes [provider]  Oxcarbazepine (TRILEPTAL) 300 MG tablet Take 1 tablet (300 mg total) by mouth 2 (two) times daily. Patient taking differently: Take 600 mg by mouth at bedtime. 12/31/17  Yes Calvert Cantor, MD  pantoprazole (PROTONIX) 40 MG tablet Take 40 mg by mouth daily.   Yes [provider]  Pediatric Multivitamins-Iron (CEROVITE JR PO) Take 1 tablet by mouth daily.   Yes [provider]  risperiDONE (RISPERDAL M-TABS) 2 MG disintegrating tablet Take 1 tablet (2 mg total) by mouth 2 (two) times daily. 12/31/17  Yes Calvert Cantor, MD  vitamin B-12 (CYANOCOBALAMIN) 250 MCG tablet Take 250 mcg by mouth daily.   Yes [provider]  clonazePAM (KLONOPIN) 0.5 MG disintegrating tablet Take 1 tablet (0.5 mg total) by mouth 2 (two) times daily. Patient not taking: Reported on 04/27/2022 12/31/17   Calvert Cantor, MD  HYDROcodone-homatropine [facility]) 5-1.5 MG/5ML syrup Take 5 mLs by mouth every 6 (six) hours as needed for cough. Patient not taking: Reported on 04/27/2022 02/06/19   Mardella Layman, MD  ondansetron [facility]) 4 MG/5ML solution Take 10 mLs (8 mg total) by mouth every 8 (eight) hours as needed for nausea or vomiting. Patient not taking: Reported on 04/27/2022 09/26/19   Wallis Bamberg, PA-C      Allergies    Haloperidol and Codeine    Review of Systems   Review of Systems  Cardiovascular:  Positive for chest pain.    Physical Exam Updated Vital Signs BP (!) 99/59   Pulse (!) 55   Temp 98.6 F (37 C) (Oral)   Resp (!) 9   Ht 5\' 7"  (1.702 m)   Wt 84 kg   SpO2 96%   BMI 29.00 kg/m  Physical Exam Vitals and nursing Mosley reviewed.  Constitutional:      General: He is not in acute distress.    Appearance: He is well-developed.  HENT:     Head: Normocephalic and atraumatic.  Cardiovascular:     Rate and Rhythm: Normal rate and regular rhythm.     Pulses:          Radial pulses are 2+ on the right side and 2+ on the left side.      Heart sounds: Normal heart sounds.  Pulmonary:     Effort: Pulmonary effort is normal.     Breath sounds: Normal breath sounds.  Chest:     Chest wall: No tenderness.  Abdominal:     Palpations: Abdomen is soft.     Tenderness: There is abdominal tenderness (Epigastrium). There is no guarding or rebound.  Musculoskeletal:        General: Normal range of motion.     Cervical back: Normal range of motion and neck supple.     Right lower leg: No edema.     Left lower leg: No edema.  Skin:    General: Skin is warm and dry.  Neurological:     General: No focal deficit present.     Mental Status: He is alert.  Psychiatric:        Mood and Affect: Mood normal.        Behavior: Behavior normal.     ED Results / Procedures / Treatments   Labs (all labs ordered are listed, but only abnormal results are displayed) Labs Reviewed  COMPREHENSIVE METABOLIC PANEL - Abnormal; Notable for the following components:      Result Value   Glucose, Bld 114 (*)    Total Protein 6.0 (*)    All other components within normal limits  CBC WITH DIFFERENTIAL/PLATELET - Abnormal; Notable for the following components:   HCT 38.9 (*)    All other components within normal limits  LIPASE, BLOOD  TROPONIN I (HIGH SENSITIVITY)  TROPONIN I (HIGH SENSITIVITY)    EKG EKG Interpretation  Date/Time:  Thursday April 27 2022 19:50:41 EDT Ventricular Rate:  61 PR Interval:  153 QRS Duration: 99 QT Interval:  426 QTC Calculation: 430 R Axis:   53 Text Interpretation: Sinus rhythm No significant change since last tracing today Confirmed by Elayne Snare ([id number]) on 04/27/2022 8:13:26 PM  Radiology DG Abd Acute W/Chest  Result Date: 04/27/2022 CLINICAL DATA:  Chest pain EXAM: DG ABDOMEN ACUTE WITH 1 VIEW CHEST COMPARISON:  12/30/2017 FINDINGS: Supine and upright frontal views of the abdomen as well as an upright frontal view of the chest are obtained. Cardiac silhouette is unremarkable. No airspace  disease, effusion, or pneumothorax. Bowel gas pattern is unremarkable without obstruction or ileus. There are no masses or abnormal calcifications. No free gas in the greater peritoneal sac. No acute bony abnormalities. IMPRESSION: 1. Unremarkable abdominal series. Electronically Signed   By: Sharlet Salina M.D.   On: 04/27/2022 22:00    Procedures Procedures    Medications Ordered in ED Medications  famotidine (PEPCID) tablet 20 mg (20 mg Oral Given 04/27/22 2056)  alum & mag hydroxide-simeth (  MAALOX/MYLANTA) 200-200-20 MG/5ML suspension 30 mL (30 mLs Oral Given 04/27/22 2056)    ED Course/ Medical Decision Making/ A&P Clinical Course as of 04/28/22 0005  Thu Apr 27, 2022  2322 Patient with improvement of pain after GI cocktail. Will have repeat trop as chest pain started just prior to arrival. [VK]  2358 Rpt troponin negative and he remains chest pain free. He is stable for discharge home with PCP follow up. [VK]    Clinical Course User Index [VK] Rexford Maus, DO                             Medical Decision Making This patient presents to the ED with chief complaint(s) of chest pain with pertinent past medical history of autism, cognitive delay, seizure disorder which further complicates the presenting complaint. The complaint involves an extensive differential diagnosis and also carries with it a high risk of complications and morbidity.    The differential diagnosis includes ACS, arrhythmia, anemia, gastritis, GERD, pancreatitis, hepatitis, PUD, pneumonia, pneumothorax, pulmonary edema, pleural effusion, patient is PERC negative making PE unlikely  Additional history obtained: Additional history obtained from family Records reviewed Care Everywhere/External Records - outpatient GI records  ED Course and Reassessment: On patient's arrival to the emergency department he is well-appearing in no acute distress.  EKG on arrival showed normal sinus rhythm without acute ischemic  changes.  Due to patient's limited history he will have labs including troponin, LFTs and lipase as well as a chest x-ray and abdominal x-ray.  He will given a GI cocktail and will be closely reassessed.  Independent labs interpretation:  The following labs were independently interpreted: Within normal range  Independent visualization of imaging: - I independently visualized the following imaging with scope of interpretation limited to determining acute life threatening conditions related to emergency care: Chest/abdomen x-ray, which revealed no acute disease  Consultation: - Consulted or discussed management/test interpretation w/ external professional: N/A  Consideration for admission or further workup: Patient has no emergent conditions requiring admission or further work-up at this time and is stable for discharge home with primary care follow-up  Social Determinants of health: N/A    Amount and/or Complexity of Data Reviewed Labs: ordered. Radiology: ordered.  Risk OTC drugs.           Final Clinical Impression(s) / ED Diagnoses Final diagnoses:  Atypical chest pain  Gastroesophageal reflux disease, unspecified whether esophagitis present    Rx / DC Orders ED Discharge Orders          Ordered    famotidine (PEPCID) 20 MG tablet  2 times daily        04/28/22 0004    alum & mag hydroxide-simeth (MAALOX MAX) 400-400-40 MG/5ML suspension  Every 6 hours PRN        04/28/22 0004              Elayne Snare K, DO 04/28/22 0005

## 2022-04-27 NOTE — ED Provider Notes (Incomplete)
[facility] AT [facility] Provider Note   CSN: [id number] Arrival date & time: 04/27/22  1937     History {Add pertinent medical, surgical, social history, OB history to HPI:1} Chief Complaint  Patient presents with  . Chest Pain    Clifford Mosley is a 27 y.o. male.  Patient is a 27 year old male with a past medical history of autism, cognitive delay and seizure disorder presenting to the emergency department with chest pain.  Patient is here with his parents who states that he lives at a group home and they called and told them that just before coming to the emergency department he fell to the ground holding his chest complaining of pain.  They state that he did not lose consciousness.  They were not given any additional history but were given no report of him having any vomiting.  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  She also reports has had a cholecystectomy.  The history is provided by a parent. The history is limited by a developmental delay.  Chest Pain      Home Medications Prior to Admission medications   Medication Sig Start Date End Date Taking? Authorizing Provider  cetirizine (ZYRTEC) 10 MG tablet Take 10 mg by mouth daily.   Yes [provider]  Docusate Sodium (COLACE PO) Take 1 tablet by mouth daily.   Yes [provider]  ferrous sulfate 220 (44 Fe) MG/5ML solution Take 220 mg by mouth daily.   Yes [provider]  fluticasone (FLONASE) 50 MCG/ACT nasal spray Place 1 spray into both nostrils daily.   Yes [provider]  hydrOXYzine (VISTARIL) 50 MG capsule Take 50 mg by mouth at bedtime. 03/28/22  Yes [provider]  Oxcarbazepine (TRILEPTAL) 300 MG tablet Take 1 tablet (300 mg total) by mouth 2 (two) times daily. Patient taking differently: Take 600 mg by mouth at bedtime. 12/31/17  Yes Calvert Cantor, MD   pantoprazole (PROTONIX) 40 MG tablet Take 40 mg by mouth daily.   Yes [provider]  Pediatric Multivitamins-Iron (CEROVITE JR PO) Take 1 tablet by mouth daily.   Yes [provider]  risperiDONE (RISPERDAL M-TABS) 2 MG disintegrating tablet Take 1 tablet (2 mg total) by mouth 2 (two) times daily. 12/31/17  Yes Calvert Cantor, MD  vitamin B-12 (CYANOCOBALAMIN) 250 MCG tablet Take 250 mcg by mouth daily.   Yes [provider]  clonazePAM (KLONOPIN) 0.5 MG disintegrating tablet Take 1 tablet (0.5 mg total) by mouth 2 (two) times daily. Patient not taking: Reported on 04/27/2022 12/31/17   Calvert Cantor, MD  HYDROcodone-homatropine [facility]) 5-1.5 MG/5ML syrup Take 5 mLs by mouth every 6 (six) hours as needed for cough. Patient not taking: Reported on 04/27/2022 02/06/19   Mardella Layman, MD  ondansetron [facility]) 4 MG/5ML solution Take 10 mLs (8 mg total) by mouth every 8 (eight) hours as needed for nausea or vomiting. Patient not taking: Reported on 04/27/2022 09/26/19   Wallis Bamberg, PA-C      Allergies    Haloperidol and Codeine    Review of Systems   Review of Systems  Cardiovascular:  Positive for chest pain.    Physical Exam Updated Vital Signs BP (!) 99/59   Pulse (!) 55   Temp 98.6 F (37 C) (Oral)   Resp (!) 9   Ht  (1.702 m)   Wt  84 kg   SpO2 96%   BMI 29.00 kg/m  Physical Exam Vitals and nursing note reviewed.  Constitutional:      General: He is not in acute distress.    Appearance: He is well-developed.  HENT:     Head: Normocephalic and atraumatic.  Cardiovascular:     Rate and Rhythm: Normal rate and regular rhythm.     Pulses:          Radial pulses are 2+ on the right side and 2+ on the left side.     Heart sounds: Normal heart sounds.  Pulmonary:     Effort: Pulmonary effort is normal.     Breath sounds: Normal breath sounds.  Chest:     Chest wall: No tenderness.  Abdominal:     Palpations: Abdomen is soft.     Tenderness:  There is abdominal tenderness (Epigastrium). There is no guarding or rebound.  Musculoskeletal:        General: Normal range of motion.     Cervical back: Normal range of motion and neck supple.     Right lower leg: No edema.     Left lower leg: No edema.  Skin:    General: Skin is warm and dry.  Neurological:     General: No focal deficit present.     Mental Status: He is alert.  Psychiatric:        Mood and Affect: Mood normal.        Behavior: Behavior normal.     ED Results / Procedures / Treatments   Labs (all labs ordered are listed, but only abnormal results are displayed) Labs Reviewed  COMPREHENSIVE METABOLIC PANEL - Abnormal; Notable for the following components:      Result Value   Glucose, Bld 114 (*)    Total Protein 6.0 (*)    All other components within normal limits  CBC WITH DIFFERENTIAL/PLATELET - Abnormal; Notable for the following components:   HCT 38.9 (*)    All other components within normal limits  LIPASE, BLOOD  TROPONIN I (HIGH SENSITIVITY)  TROPONIN I (HIGH SENSITIVITY)    EKG EKG Interpretation  Date/Time:  Thursday April 27 2022 19:50:41 EDT Ventricular Rate:  61 PR Interval:  153 QRS Duration: 99 QT Interval:  426 QTC Calculation: 430 R Axis:   53 Text Interpretation: Sinus rhythm No significant change since last tracing today Confirmed by Elayne Snare ([id number]) on 04/27/2022 8:13:26 PM  Radiology DG Abd Acute W/Chest  Result Date: 04/27/2022 CLINICAL DATA:  Chest pain EXAM: DG ABDOMEN ACUTE WITH 1 VIEW CHEST COMPARISON:  12/30/2017 FINDINGS: Supine and upright frontal views of the abdomen as well as an upright frontal view of the chest are obtained. Cardiac silhouette is unremarkable. No airspace disease, effusion, or pneumothorax. Bowel gas pattern is unremarkable without obstruction or ileus. There are no masses or abnormal calcifications. No free gas in the greater peritoneal sac. No acute bony abnormalities. IMPRESSION: 1.  Unremarkable abdominal series. Electronically Signed   By: Sharlet Salina M.D.   On: 04/27/2022 22:00    Procedures Procedures  {Document cardiac monitor, telemetry assessment procedure when appropriate:1}  Medications Ordered in ED Medications  famotidine (PEPCID) tablet 20 mg (20 mg Oral Given 04/27/22 2056)  alum & mag hydroxide-simeth (MAALOX/MYLANTA) 200-200-20 MG/5ML suspension 30 mL (30 mLs Oral Given 04/27/22 2056)    ED Course/ Medical Decision Making/ A&P Clinical Course as of 04/27/22 2359  Thu Apr 27, 2022  2322 Patient with improvement of  pain after GI cocktail. Will have repeat trop as chest pain started just prior to arrival. [VK]  2358 Rpt troponin negative and he remains chest pain free. He is stable for discharge home with PCP follow up. [VK]    Clinical Course User Index [VK] Rexford Maus, DO   {   Click here for ABCD2, HEART and other calculatorsREFRESH Note before signing :1}                          Medical Decision Making This patient presents to the ED with chief complaint(s) of chest pain with pertinent past medical history of autism, cognitive delay, seizure disorder which further complicates the presenting complaint. The complaint involves an extensive differential diagnosis and also carries with it a high risk of complications and morbidity.    The differential diagnosis includes ACS, arrhythmia, anemia, gastritis, GERD, pancreatitis, hepatitis, PUD, pneumonia, pneumothorax, pulmonary edema, pleural effusion, patient is PERC negative making PE unlikely  Additional history obtained: Additional history obtained from family Records reviewed Care Everywhere/External Records - outpatient GI records  ED Course and Reassessment: On patient's arrival to the emergency department he is well-appearing in no acute distress.  EKG on arrival showed normal sinus rhythm without acute ischemic changes.  Due to patient's limited history he will have labs including  troponin, LFTs and lipase as well as a chest x-ray and abdominal x-ray.  He will given a GI cocktail and will be closely reassessed.  Independent labs interpretation:  The following labs were independently interpreted: Within normal range  Independent visualization of imaging: - I independently visualized the following imaging with scope of interpretation limited to determining acute life threatening conditions related to emergency care: Chest/abdomen x-ray, which revealed no acute disease  Consultation: - Consulted or discussed management/test interpretation w/ external professional: N/A  Consideration for admission or further workup: Patient has no emergent conditions requiring admission or further work-up at this time and is stable for discharge home with primary care follow-up  Social Determinants of health: N/A    Amount and/or Complexity of Data Reviewed Labs: ordered. Radiology: ordered.  Risk OTC drugs.   ***  {Document critical care time when appropriate:1} {Document review of labs and clinical decision tools ie heart score, Chads2Vasc2 etc:1}  {Document your independent review of radiology images, and any outside records:1} {Document your discussion with family members, caretakers, and with consultants:1} {Document social determinants of health affecting pt's care:1} {Document your decision making why or why not admission, treatments were needed:1} Final Clinical Impression(s) / ED Diagnoses Final diagnoses:  None    Rx / DC Orders ED Discharge Orders     None

## 2022-04-27 NOTE — ED Triage Notes (Signed)
Pt with hx of developmental delay keeps pointing at his chest. Only hx able to obtain is acid reflux medications. Pt is from a group home. EMS didn't know the name of it. His mother is in route.

## 2022-04-28 MED ORDER — MAALOX MAX 400-400-40 MG/5ML PO SUSP: 15.0000 mL | Freq: Four times a day (QID) | ORAL | 0 refills | Status: AC | PRN

## 2022-04-28 MED ORDER — FAMOTIDINE 20 MG PO TABS: 20.0000 mg | ORAL_TABLET | Freq: Two times a day (BID) | ORAL | 0 refills | Status: AC

## 2022-04-28 NOTE — Discharge Instructions (Signed)
You were seen in the emergency department for chest pain.  Your workup showed no signs of heart attack, no signs of bleeding ulcers or stomach obstruction.  This is likely due to acid reflux and I have given you an antacid, Pepcid, that you should take daily for at least the next 2 weeks and then can start taking as needed.  You can also take Maalox as needed for additional symptoms.  You can follow-up with your primary doctor in the next few days to have your symptoms rechecked.  You should return to the emergency department for significantly worsening pain, repetitive vomiting, fevers, you pass out or if you have any other new or concerning symptoms.

## 2024-03-18 ENCOUNTER — Ambulatory Visit: Admitting: Dermatology
